# Patient Record
Sex: Male | Born: 1980 | Race: White | Hispanic: No | Smoking: Current every day smoker
Health system: Southern US, Community
[De-identification: ages and names within clinical notes are randomized; demographics above are authoritative.]

## PROBLEM LIST (undated history)

## (undated) ENCOUNTER — Encounter

## (undated) ENCOUNTER — Ambulatory Visit: Payer: PRIVATE HEALTH INSURANCE | Attending: Anesthesiology | Primary: Anesthesiology

## (undated) ENCOUNTER — Ambulatory Visit: Payer: PRIVATE HEALTH INSURANCE

## (undated) ENCOUNTER — Telehealth

## (undated) ENCOUNTER — Encounter: Attending: Anesthesiology | Primary: Anesthesiology

## (undated) ENCOUNTER — Ambulatory Visit: Payer: Medicaid (Managed Care) | Attending: Emergency Medicine | Primary: Emergency Medicine

## (undated) ENCOUNTER — Ambulatory Visit

## (undated) ENCOUNTER — Ambulatory Visit: Payer: PRIVATE HEALTH INSURANCE | Attending: Clinical | Primary: Clinical

## (undated) ENCOUNTER — Ambulatory Visit: Payer: Medicaid (Managed Care)

## (undated) ENCOUNTER — Ambulatory Visit: Payer: Medicaid (Managed Care) | Attending: Anesthesiology | Primary: Anesthesiology

## (undated) ENCOUNTER — Encounter
Attending: Pharmacist Clinician (PhC)/ Clinical Pharmacy Specialist | Primary: Pharmacist Clinician (PhC)/ Clinical Pharmacy Specialist

## (undated) ENCOUNTER — Encounter: Attending: Family | Primary: Family

## (undated) DIAGNOSIS — F1510 Other stimulant abuse, uncomplicated: Secondary | ICD-10-CM

## (undated) DIAGNOSIS — Z8673 Personal history of transient ischemic attack (TIA), and cerebral infarction without residual deficits: Secondary | ICD-10-CM

## (undated) DIAGNOSIS — Z8619 Personal history of other infectious and parasitic diseases: Secondary | ICD-10-CM

## (undated) DIAGNOSIS — B1920 Unspecified viral hepatitis C without hepatic coma: Secondary | ICD-10-CM

## (undated) DIAGNOSIS — I10 Essential (primary) hypertension: Secondary | ICD-10-CM

## (undated) DIAGNOSIS — F1110 Opioid abuse, uncomplicated: Secondary | ICD-10-CM

---

## 2004-08-13 ENCOUNTER — Emergency Department: Payer: Self-pay | Admitting: Emergency Medicine

## 2004-09-18 ENCOUNTER — Emergency Department: Payer: Self-pay | Admitting: General Practice

## 2004-11-01 ENCOUNTER — Emergency Department: Payer: Self-pay | Admitting: Internal Medicine

## 2004-12-29 ENCOUNTER — Emergency Department: Payer: Self-pay | Admitting: Emergency Medicine

## 2005-01-15 ENCOUNTER — Emergency Department: Payer: Self-pay | Admitting: Emergency Medicine

## 2005-02-24 ENCOUNTER — Emergency Department: Payer: Self-pay | Admitting: Emergency Medicine

## 2005-05-16 ENCOUNTER — Other Ambulatory Visit: Payer: Self-pay

## 2005-05-16 ENCOUNTER — Emergency Department: Payer: Self-pay | Admitting: Emergency Medicine

## 2005-05-25 ENCOUNTER — Emergency Department: Payer: Self-pay | Admitting: Emergency Medicine

## 2005-09-27 ENCOUNTER — Emergency Department: Payer: Self-pay | Admitting: Emergency Medicine

## 2006-01-06 ENCOUNTER — Emergency Department: Payer: Self-pay | Admitting: General Practice

## 2006-03-07 ENCOUNTER — Emergency Department: Payer: Self-pay | Admitting: Emergency Medicine

## 2006-05-27 ENCOUNTER — Other Ambulatory Visit: Payer: Self-pay

## 2006-05-27 ENCOUNTER — Emergency Department: Payer: Self-pay | Admitting: Emergency Medicine

## 2006-09-22 ENCOUNTER — Emergency Department: Payer: Self-pay | Admitting: Emergency Medicine

## 2006-11-24 ENCOUNTER — Emergency Department: Payer: Self-pay | Admitting: Emergency Medicine

## 2007-02-10 ENCOUNTER — Emergency Department: Payer: Self-pay | Admitting: Emergency Medicine

## 2007-04-09 ENCOUNTER — Emergency Department: Payer: Self-pay | Admitting: Emergency Medicine

## 2007-07-19 ENCOUNTER — Other Ambulatory Visit: Payer: Self-pay

## 2007-07-19 ENCOUNTER — Emergency Department: Payer: Self-pay | Admitting: Emergency Medicine

## 2007-08-16 ENCOUNTER — Emergency Department: Payer: Self-pay | Admitting: Emergency Medicine

## 2008-04-17 ENCOUNTER — Emergency Department: Payer: Self-pay | Admitting: Emergency Medicine

## 2009-10-10 ENCOUNTER — Ambulatory Visit: Payer: Self-pay | Admitting: Family Medicine

## 2010-01-23 ENCOUNTER — Ambulatory Visit: Payer: Self-pay

## 2010-06-01 ENCOUNTER — Observation Stay: Payer: Self-pay | Admitting: Internal Medicine

## 2010-06-02 ENCOUNTER — Emergency Department: Payer: Self-pay | Admitting: Internal Medicine

## 2011-01-02 ENCOUNTER — Emergency Department: Payer: Self-pay | Admitting: Emergency Medicine

## 2011-04-07 ENCOUNTER — Emergency Department: Payer: Self-pay | Admitting: Emergency Medicine

## 2011-06-07 ENCOUNTER — Emergency Department: Payer: Self-pay | Admitting: Emergency Medicine

## 2011-07-18 ENCOUNTER — Emergency Department: Payer: Self-pay | Admitting: Emergency Medicine

## 2011-08-02 ENCOUNTER — Emergency Department: Payer: Self-pay | Admitting: Internal Medicine

## 2011-09-27 ENCOUNTER — Emergency Department: Payer: Self-pay | Admitting: Internal Medicine

## 2012-06-28 ENCOUNTER — Emergency Department: Payer: Self-pay | Admitting: Emergency Medicine

## 2013-02-08 ENCOUNTER — Emergency Department: Payer: Self-pay | Admitting: Emergency Medicine

## 2013-06-08 ENCOUNTER — Emergency Department: Payer: Self-pay | Admitting: Emergency Medicine

## 2013-06-08 LAB — URINALYSIS, COMPLETE
Bilirubin,UR: NEGATIVE
Glucose,UR: 50 mg/dL (ref 0–75)
Nitrite: NEGATIVE
Ph: 6 (ref 4.5–8.0)
Protein: NEGATIVE
RBC,UR: 4 /HPF (ref 0–5)
Specific Gravity: 1.021 (ref 1.003–1.030)
WBC UR: 17 /HPF (ref 0–5)

## 2013-06-08 LAB — HEPATIC FUNCTION PANEL A (ARMC)
Alkaline Phosphatase: 78 U/L (ref 50–136)
SGOT(AST): 48 U/L — ABNORMAL HIGH (ref 15–37)
SGPT (ALT): 108 U/L — ABNORMAL HIGH (ref 12–78)

## 2013-06-08 LAB — BASIC METABOLIC PANEL
Anion Gap: 5 — ABNORMAL LOW (ref 7–16)
BUN: 13 mg/dL (ref 7–18)
Calcium, Total: 8.7 mg/dL (ref 8.5–10.1)
Co2: 27 mmol/L (ref 21–32)
Creatinine: 0.93 mg/dL (ref 0.60–1.30)
EGFR (Non-African Amer.): >60
Potassium: 4 mmol/L (ref 3.5–5.1)

## 2013-06-08 LAB — CBC
HCT: 48.1 % (ref 40.0–52.0)
MCH: 30.7 pg (ref 26.0–34.0)
MCV: 88 fL (ref 80–100)
RBC: 5.46 10*6/uL (ref 4.40–5.90)
WBC: 8.7 10*3/uL (ref 3.8–10.6)

## 2013-06-08 LAB — LIPASE, BLOOD: Lipase: 174 U/L (ref 73–393)

## 2013-06-10 LAB — URINE CULTURE

## 2013-08-06 ENCOUNTER — Emergency Department: Payer: Self-pay | Admitting: Internal Medicine

## 2013-08-06 LAB — CBC
HCT: 45.8 % (ref 40.0–52.0)
MCH: 30.7 pg (ref 26.0–34.0)
MCV: 89 fL (ref 80–100)
RBC: 5.14 10*6/uL (ref 4.40–5.90)
WBC: 5.9 10*3/uL (ref 3.8–10.6)

## 2013-08-06 LAB — URINALYSIS, COMPLETE
Bacteria: NONE SEEN
Bilirubin,UR: NEGATIVE
Blood: NEGATIVE
Ph: 6 (ref 4.5–8.0)
RBC,UR: 1 /HPF (ref 0–5)
Squamous Epithelial: NONE SEEN

## 2013-08-06 LAB — COMPREHENSIVE METABOLIC PANEL
Albumin: 3.7 g/dL (ref 3.4–5.0)
Anion Gap: 4 — ABNORMAL LOW (ref 7–16)
BUN: 17 mg/dL (ref 7–18)
Calcium, Total: 8.6 mg/dL (ref 8.5–10.1)
Co2: 25 mmol/L (ref 21–32)
EGFR (African American): >60
EGFR (Non-African Amer.): >60
Glucose: 105 mg/dL — ABNORMAL HIGH (ref 65–99)
Osmolality: 278 (ref 275–301)
Potassium: 4 mmol/L (ref 3.5–5.1)
SGPT (ALT): 47 U/L (ref 12–78)
Total Protein: 6.9 g/dL (ref 6.4–8.2)

## 2013-12-27 ENCOUNTER — Emergency Department: Payer: Self-pay | Admitting: Emergency Medicine

## 2013-12-27 LAB — BASIC METABOLIC PANEL
ANION GAP: 5 — AB (ref 7–16)
BUN: 16 mg/dL (ref 7–18)
CHLORIDE: 109 mmol/L — AB (ref 98–107)
CO2: 24 mmol/L (ref 21–32)
Calcium, Total: 9.5 mg/dL (ref 8.5–10.1)
Creatinine: 1.1 mg/dL (ref 0.60–1.30)
EGFR (Non-African Amer.): >60
GLUCOSE: 94 mg/dL (ref 65–99)
Osmolality: 277 (ref 275–301)
Potassium: 3.9 mmol/L (ref 3.5–5.1)
Sodium: 138 mmol/L (ref 136–145)

## 2013-12-27 LAB — CBC
HCT: 49.5 % (ref 40.0–52.0)
HGB: 16.7 g/dL (ref 13.0–18.0)
MCH: 30.1 pg (ref 26.0–34.0)
MCHC: 33.7 g/dL (ref 32.0–36.0)
MCV: 89 fL (ref 80–100)
PLATELETS: 221 10*3/uL (ref 150–440)
RBC: 5.55 10*6/uL (ref 4.40–5.90)
RDW: 13.7 % (ref 11.5–14.5)
WBC: 9.3 10*3/uL (ref 3.8–10.6)

## 2013-12-27 LAB — TROPONIN I

## 2014-01-12 ENCOUNTER — Emergency Department: Payer: Self-pay | Admitting: Emergency Medicine

## 2014-03-07 ENCOUNTER — Emergency Department: Payer: Self-pay | Admitting: Emergency Medicine

## 2014-03-07 LAB — COMPREHENSIVE METABOLIC PANEL
ALBUMIN: 3.8 g/dL (ref 3.4–5.0)
ANION GAP: 7 (ref 7–16)
AST: 31 U/L (ref 15–37)
Alkaline Phosphatase: 76 U/L
BILIRUBIN TOTAL: 0.5 mg/dL (ref 0.2–1.0)
BUN: 17 mg/dL (ref 7–18)
CHLORIDE: 107 mmol/L (ref 98–107)
CO2: 26 mmol/L (ref 21–32)
CREATININE: 1.05 mg/dL (ref 0.60–1.30)
Calcium, Total: 9.2 mg/dL (ref 8.5–10.1)
EGFR (African American): >60
GLUCOSE: 92 mg/dL (ref 65–99)
OSMOLALITY: 281 (ref 275–301)
Potassium: 4.1 mmol/L (ref 3.5–5.1)
SGPT (ALT): 68 U/L (ref 12–78)
Sodium: 140 mmol/L (ref 136–145)
TOTAL PROTEIN: 8.1 g/dL (ref 6.4–8.2)

## 2014-03-07 LAB — CBC WITH DIFFERENTIAL/PLATELET
Basophil #: 0 10*3/uL (ref 0.0–0.1)
Basophil %: 0.3 %
Eosinophil #: 0.2 10*3/uL (ref 0.0–0.7)
Eosinophil %: 2.1 %
HCT: 53.6 % — AB (ref 40.0–52.0)
HGB: 17.5 g/dL (ref 13.0–18.0)
LYMPHS ABS: 1.9 10*3/uL (ref 1.0–3.6)
LYMPHS PCT: 22.6 %
MCH: 29.3 pg (ref 26.0–34.0)
MCHC: 32.7 g/dL (ref 32.0–36.0)
MCV: 90 fL (ref 80–100)
MONOS PCT: 7.5 %
Monocyte #: 0.6 x10 3/mm (ref 0.2–1.0)
NEUTROS ABS: 5.8 10*3/uL (ref 1.4–6.5)
Neutrophil %: 67.5 %
PLATELETS: 213 10*3/uL (ref 150–440)
RBC: 5.98 10*6/uL — ABNORMAL HIGH (ref 4.40–5.90)
RDW: 13.6 % (ref 11.5–14.5)
WBC: 8.6 10*3/uL (ref 3.8–10.6)

## 2015-06-07 ENCOUNTER — Encounter: Payer: Self-pay | Admitting: *Deleted

## 2015-06-07 ENCOUNTER — Emergency Department
Admission: EM | Admit: 2015-06-07 | Discharge: 2015-06-07 | Disposition: A | Discharge status: Home / Self Care | Payer: Self-pay | Attending: Emergency Medicine | Admitting: Emergency Medicine

## 2015-06-07 DIAGNOSIS — N390 Urinary tract infection, site not specified: Secondary | ICD-10-CM | POA: Insufficient documentation

## 2015-06-07 DIAGNOSIS — Z720 Tobacco use: Secondary | ICD-10-CM | POA: Insufficient documentation

## 2015-06-07 DIAGNOSIS — Y998 Other external cause status: Secondary | ICD-10-CM | POA: Insufficient documentation

## 2015-06-07 DIAGNOSIS — Y9389 Activity, other specified: Secondary | ICD-10-CM | POA: Insufficient documentation

## 2015-06-07 DIAGNOSIS — W57XXXA Bitten or stung by nonvenomous insect and other nonvenomous arthropods, initial encounter: Secondary | ICD-10-CM | POA: Insufficient documentation

## 2015-06-07 DIAGNOSIS — S30861A Insect bite (nonvenomous) of abdominal wall, initial encounter: Secondary | ICD-10-CM | POA: Insufficient documentation

## 2015-06-07 DIAGNOSIS — Z880 Allergy status to penicillin: Secondary | ICD-10-CM | POA: Insufficient documentation

## 2015-06-07 DIAGNOSIS — I10 Essential (primary) hypertension: Secondary | ICD-10-CM | POA: Insufficient documentation

## 2015-06-07 DIAGNOSIS — Y9289 Other specified places as the place of occurrence of the external cause: Secondary | ICD-10-CM | POA: Insufficient documentation

## 2015-06-07 HISTORY — DX: Essential (primary) hypertension: I10

## 2015-06-07 HISTORY — DX: Personal history of other infectious and parasitic diseases: Z86.19

## 2015-06-07 LAB — COMPREHENSIVE METABOLIC PANEL
ALBUMIN: 4.3 g/dL (ref 3.5–5.0)
ALT: 49 U/L (ref 17–63)
AST: 44 U/L — AB (ref 15–41)
Alkaline Phosphatase: 59 U/L (ref 38–126)
Anion gap: 9 (ref 5–15)
BUN: 21 mg/dL — AB (ref 6–20)
CHLORIDE: 102 mmol/L (ref 101–111)
CO2: 25 mmol/L (ref 22–32)
CREATININE: 1.18 mg/dL (ref 0.61–1.24)
Calcium: 8.9 mg/dL (ref 8.9–10.3)
GFR calc Af Amer: >60 mL/min (ref >60)
GFR calc non Af Amer: >60 mL/min (ref >60)
Glucose, Bld: 102 mg/dL — ABNORMAL HIGH (ref 65–99)
POTASSIUM: 3.8 mmol/L (ref 3.5–5.1)
Sodium: 136 mmol/L (ref 135–145)
Total Bilirubin: 0.7 mg/dL (ref 0.3–1.2)
Total Protein: 7.3 g/dL (ref 6.5–8.1)

## 2015-06-07 LAB — CBC WITH DIFFERENTIAL/PLATELET
BASOS ABS: 0 10*3/uL (ref 0–0.1)
Basophils Relative: 1 %
EOS PCT: 4 %
Eosinophils Absolute: 0.3 10*3/uL (ref 0–0.7)
HCT: 46.9 % (ref 40.0–52.0)
HEMOGLOBIN: 16 g/dL (ref 13.0–18.0)
LYMPHS PCT: 25 %
Lymphs Abs: 2 10*3/uL (ref 1.0–3.6)
MCH: 30.1 pg (ref 26.0–34.0)
MCHC: 34.1 g/dL (ref 32.0–36.0)
MCV: 88.3 fL (ref 80.0–100.0)
MONO ABS: 0.8 10*3/uL (ref 0.2–1.0)
Monocytes Relative: 10 %
Neutro Abs: 4.7 10*3/uL (ref 1.4–6.5)
Neutrophils Relative %: 60 %
Platelets: 192 10*3/uL (ref 150–440)
RBC: 5.31 MIL/uL (ref 4.40–5.90)
RDW: 13.4 % (ref 11.5–14.5)
WBC: 7.8 10*3/uL (ref 3.8–10.6)

## 2015-06-07 LAB — URINALYSIS COMPLETE WITH MICROSCOPIC (ARMC ONLY)
Bilirubin Urine: NEGATIVE
Glucose, UA: 50 mg/dL — AB
KETONES UR: NEGATIVE mg/dL
NITRITE: NEGATIVE
PROTEIN: NEGATIVE mg/dL
SPECIFIC GRAVITY, URINE: 1.032 — AB (ref 1.005–1.030)
pH: 5 (ref 5.0–8.0)

## 2015-06-07 LAB — MAGNESIUM: Magnesium: 2.2 mg/dL (ref 1.7–2.4)

## 2015-06-07 MED ORDER — HYDROCODONE-ACETAMINOPHEN 5-325 MG PO TABS: 1.0000 | ORAL_TABLET | ORAL | Status: DC | PRN

## 2015-06-07 MED ORDER — HYDROCODONE-ACETAMINOPHEN 5-325 MG PO TABS
1.0000 | ORAL_TABLET | Freq: Once | ORAL | Status: AC
  Administered 2015-06-07: 1 via ORAL
  Filled 2015-06-07: qty 1

## 2015-06-07 MED ORDER — SULFAMETHOXAZOLE-TRIMETHOPRIM 800-160 MG PO TABS: 1.0000 | ORAL_TABLET | Freq: Two times a day (BID) | ORAL | Status: DC

## 2015-06-07 MED ORDER — SODIUM CHLORIDE 0.9 % IV BOLUS (SEPSIS)
1000.0000 mL | Freq: Once | INTRAVENOUS | Status: AC
  Administered 2015-06-07: 1000 mL via INTRAVENOUS

## 2015-06-07 NOTE — Discharge Instructions (Signed)
YOU SHOULD FOLLOW UP WITH UROLOGIST LISTED ON YOUR DISCHARGE PAPERS TAKE ALL OF THE ANTIBIOTICS UNTIL GONE INCREASE FLUIDS

## 2015-06-07 NOTE — ED Provider Notes (Signed)
[facility] Emergency Department Provider Note  ____________________________________________  Time seen: Approximately 1:40 PM  I have reviewed the triage vital signs and the nursing notes.   HISTORY  Chief Complaint Sore Throat; Rash; and Generalized Body Aches   HPI Isaiah Green. is a 34 y.o. male is here today with complaint of sore throat and generalized body aches with cramps for approximately 1 week. He states he sweats a lot at work and thinks this is what is causing cramps. He has been taking pain medication at home for sore throat. He states he also has a history of pulling a tick off his body approximately 3 weeks ago. He states every year for the last 3 years he has had "tick fever". He is uncertain of fever at home. He denies any other symptoms. Currently he rates his pain 9 out of 10. He denies any nausea, vomitingor diarrhea   Past Medical History  Diagnosis Date  . H/o Lyme disease   . Hypertension     There are no active problems to display for this patient.   History reviewed. No pertinent past surgical history.  Current Outpatient Rx  Name  Route  Sig  Dispense  Refill  . HYDROcodone-acetaminophen (NORCO/VICODIN) 5-325 MG per tablet   Oral   Take 1 tablet by mouth every 4 (four) hours as needed for moderate pain.   20 tablet   0   . sulfamethoxazole-trimethoprim (BACTRIM DS,SEPTRA DS) 800-160 MG per tablet   Oral   Take 1 tablet by mouth 2 (two) times daily.   20 tablet   0     Allergies Aspirin; Penicillins; and Tegretol  History reviewed. No pertinent family history.  Social History Social History  Substance Use Topics  . Smoking status: Current Every Day Smoker    Types: Cigars  . Smokeless tobacco: None  . Alcohol Use: No    Review of Systems Constitutional: Positive fever/chills ENT: No sore throat. Cardiovascular: Denies chest pain. Respiratory: Denies shortness of breath. Gastrointestinal: No abdominal  pain.  No nausea, no vomiting.  No diarrhea. Genitourinary: Negative for dysuria. Musculoskeletal: Negative for back pain. Skin: Negative for rash. Tick bites Neurological: Negative for headaches, focal weakness or numbness.  10-point ROS otherwise negative.  ____________________________________________   PHYSICAL EXAM:  VITAL SIGNS: ED Triage Vitals  Enc Vitals Group     BP 06/07/15 1305 158/83 mmHg     Pulse Rate 06/07/15 1305 95     Resp 06/07/15 1305 16     Temp 06/07/15 1305 97.7 F (36.5 C)     Temp Source 06/07/15 1305 Oral     SpO2 06/07/15 1305 98 %     Weight 06/07/15 1306 212 lb 8 oz (96.389 kg)     Height 06/07/15 1305 6\' 2"  (1.88 m)     Head Cir --      Peak Flow --      Pain Score 06/07/15 1304 9     Pain Loc --      Pain Edu? --      Excl. in GC? --     Constitutional: Alert and oriented. Well appearing and in no acute distress. Eyes: Conjunctivae are normal. PERRL. EOMI. Head: Atraumatic. Nose: No congestion/rhinnorhea. Mouth/Throat: Mucous membranes are moist.  Oropharynx non-erythematous. Neck: No stridor.  Supple Hematological/Lymphatic/Immunilogical: No cervical lymphadenopathy. Cardiovascular: Normal rate, regular rhythm. Grossly normal heart sounds.  Good peripheral circulation. Respiratory: Normal respiratory effort.  No retractions. Lungs CTAB. Gastrointestinal: Soft and  nontender. No distention. No abdominal bruits. No CVA tenderness. Musculoskeletal: No lower extremity tenderness nor edema.  No joint effusions. Neurologic:  Normal speech and language. No gross focal neurologic deficits are appreciated. No gait instability. Skin:  Skin is warm, dry. There is one single tick bite to the left side abdomen that is a small red papule there is no warmth or drainage in this area. Psychiatric: Mood and affect are normal. Speech and behavior are normal.  ____________________________________________   LABS (all labs ordered are listed, but only  abnormal results are displayed)  Labs Reviewed  COMPREHENSIVE METABOLIC PANEL - Abnormal; Notable for the following:    Glucose, Bld 102 (*)    BUN 21 (*)    AST 44 (*)    All other components within normal limits  URINALYSIS COMPLETEWITH MICROSCOPIC ([facility] ONLY) - Abnormal; Notable for the following:    Color, Urine YELLOW (*)    APPearance HAZY (*)    Glucose, UA 50 (*)    Specific Gravity, Urine 1.032 (*)    Hgb urine dipstick 1+ (*)    Leukocytes, UA 2+ (*)    Bacteria, UA RARE (*)    Squamous Epithelial / LPF 0-5 (*)    All other components within normal limits  CBC WITH DIFFERENTIAL/PLATELET  MAGNESIUM     PROCEDURES  Procedure(s) performed: None  Critical Care performed: No  ____________________________________________   INITIAL IMPRESSION / ASSESSMENT AND PLAN / ED COURSE  Pertinent labs & imaging results that were available during my care of the patient were reviewed by me and considered in my medical decision making (see chart for details).  Patient was told he had a urinary tract infection. He requested giving some fluids while he was in the emergency room as he feels dehydrated. Will start him on Bactrim DS for infection. He'll follow-up with [facility]  clinic if any continued problems. ____________________________________________   FINAL CLINICAL IMPRESSION(S) / ED DIAGNOSES  Final diagnoses:  Acute urinary tract infection      Tommi Rumps, PA-C 06/07/15 1622  Emily Filbert, MD 06/08/15 [phone]

## 2015-06-07 NOTE — ED Notes (Signed)
Pt reports sore throat, rash under arms, generalized body aches. Pulled tick off of body 3 weeks ago. Hx of tick fever and lymes disease.

## 2015-06-07 NOTE — ED Notes (Signed)
Pt states generalized body aches and cramps for 1 week, pt states he sweats a lot at work, pt been taking pain medication at home, pt alert in no distress, pt states some sore throat feeling

## 2015-06-14 ENCOUNTER — Emergency Department
Admission: EM | Admit: 2015-06-14 | Discharge: 2015-06-14 | Disposition: A | Discharge status: Home / Self Care | Payer: Self-pay | Attending: Emergency Medicine | Admitting: Emergency Medicine

## 2015-06-14 ENCOUNTER — Encounter: Payer: Self-pay | Admitting: Emergency Medicine

## 2015-06-14 DIAGNOSIS — Z79899 Other long term (current) drug therapy: Secondary | ICD-10-CM | POA: Insufficient documentation

## 2015-06-14 DIAGNOSIS — A938 Other specified arthropod-borne viral fevers: Secondary | ICD-10-CM | POA: Insufficient documentation

## 2015-06-14 DIAGNOSIS — N390 Urinary tract infection, site not specified: Secondary | ICD-10-CM

## 2015-06-14 DIAGNOSIS — Z720 Tobacco use: Secondary | ICD-10-CM | POA: Insufficient documentation

## 2015-06-14 DIAGNOSIS — Z880 Allergy status to penicillin: Secondary | ICD-10-CM | POA: Insufficient documentation

## 2015-06-14 DIAGNOSIS — B882 Other arthropod infestations: Secondary | ICD-10-CM

## 2015-06-14 DIAGNOSIS — I10 Essential (primary) hypertension: Secondary | ICD-10-CM | POA: Insufficient documentation

## 2015-06-14 LAB — CBC WITH DIFFERENTIAL/PLATELET
BASOS ABS: 0.1 10*3/uL (ref 0–0.1)
Basophils Relative: 1 %
Eosinophils Absolute: 0.2 10*3/uL (ref 0–0.7)
Eosinophils Relative: 3 %
HEMATOCRIT: 48.7 % (ref 40.0–52.0)
HEMOGLOBIN: 16.2 g/dL (ref 13.0–18.0)
LYMPHS PCT: 22 %
Lymphs Abs: 2 10*3/uL (ref 1.0–3.6)
MCH: 29.8 pg (ref 26.0–34.0)
MCHC: 33.3 g/dL (ref 32.0–36.0)
MCV: 89.4 fL (ref 80.0–100.0)
MONOS PCT: 5 %
Monocytes Absolute: 0.4 10*3/uL (ref 0.2–1.0)
NEUTROS PCT: 69 %
Neutro Abs: 6.3 10*3/uL (ref 1.4–6.5)
Platelets: 182 10*3/uL (ref 150–440)
RBC: 5.45 MIL/uL (ref 4.40–5.90)
RDW: 13.8 % (ref 11.5–14.5)
WBC: 8.9 10*3/uL (ref 3.8–10.6)

## 2015-06-14 LAB — COMPREHENSIVE METABOLIC PANEL
ALK PHOS: 56 U/L (ref 38–126)
ALT: 39 U/L (ref 17–63)
AST: 38 U/L (ref 15–41)
Albumin: 4.1 g/dL (ref 3.5–5.0)
Anion gap: 7 (ref 5–15)
BILIRUBIN TOTAL: 0.5 mg/dL (ref 0.3–1.2)
BUN: 13 mg/dL (ref 6–20)
CALCIUM: 8.6 mg/dL — AB (ref 8.9–10.3)
CO2: 23 mmol/L (ref 22–32)
CREATININE: 1.13 mg/dL (ref 0.61–1.24)
Chloride: 103 mmol/L (ref 101–111)
GFR calc Af Amer: >60 mL/min (ref >60)
GFR calc non Af Amer: >60 mL/min (ref >60)
GLUCOSE: 151 mg/dL — AB (ref 65–99)
Potassium: 3.5 mmol/L (ref 3.5–5.1)
Sodium: 133 mmol/L — ABNORMAL LOW (ref 135–145)
TOTAL PROTEIN: 7.2 g/dL (ref 6.5–8.1)

## 2015-06-14 LAB — CHLAMYDIA/NGC RT PCR (ARMC ONLY)
Chlamydia Tr: NOT DETECTED
N gonorrhoeae: NOT DETECTED

## 2015-06-14 LAB — URINALYSIS COMPLETE WITH MICROSCOPIC (ARMC ONLY)
Bacteria, UA: NONE SEEN
Bilirubin Urine: NEGATIVE
Glucose, UA: 50 mg/dL — AB
HGB URINE DIPSTICK: NEGATIVE
Ketones, ur: NEGATIVE mg/dL
Nitrite: NEGATIVE
Protein, ur: NEGATIVE mg/dL
Specific Gravity, Urine: 1.028 (ref 1.005–1.030)
pH: 5 (ref 5.0–8.0)

## 2015-06-14 LAB — CK: CK TOTAL: 82 U/L (ref 49–397)

## 2015-06-14 MED ORDER — ONDANSETRON HCL 4 MG/2ML IJ SOLN
INTRAMUSCULAR | Status: AC
  Administered 2015-06-14: 4 mg via INTRAVENOUS
  Filled 2015-06-14: qty 2

## 2015-06-14 MED ORDER — OXYCODONE-ACETAMINOPHEN 5-325 MG PO TABS
1.0000 | ORAL_TABLET | Freq: Once | ORAL | Status: AC
  Administered 2015-06-14: 1 via ORAL

## 2015-06-14 MED ORDER — NITROFURANTOIN MONOHYD MACRO 100 MG PO CAPS
100.0000 mg | ORAL_CAPSULE | Freq: Once | ORAL | Status: AC
  Administered 2015-06-14: 100 mg via ORAL

## 2015-06-14 MED ORDER — DOXYCYCLINE HYCLATE 100 MG PO TABS
ORAL_TABLET | ORAL | Status: AC
  Administered 2015-06-14: 100 mg via ORAL
  Filled 2015-06-14: qty 1

## 2015-06-14 MED ORDER — SODIUM CHLORIDE 0.9 % IV BOLUS (SEPSIS)
1000.0000 mL | Freq: Once | INTRAVENOUS | Status: AC
  Administered 2015-06-14: 1000 mL via INTRAVENOUS

## 2015-06-14 MED ORDER — NITROFURANTOIN MONOHYD MACRO 100 MG PO CAPS
ORAL_CAPSULE | ORAL | Status: AC
  Filled 2015-06-14: qty 1

## 2015-06-14 MED ORDER — ONDANSETRON HCL 4 MG/2ML IJ SOLN
4.0000 mg | Freq: Once | INTRAMUSCULAR | Status: AC
  Administered 2015-06-14: 4 mg via INTRAVENOUS

## 2015-06-14 MED ORDER — DOXYCYCLINE HYCLATE 100 MG PO TABS
100.0000 mg | ORAL_TABLET | Freq: Once | ORAL | Status: AC
  Administered 2015-06-14: 100 mg via ORAL
  Filled 2015-06-14: qty 1

## 2015-06-14 MED ORDER — NITROFURANTOIN MONOHYD MACRO 100 MG PO CAPS: 100.0000 mg | ORAL_CAPSULE | Freq: Two times a day (BID) | ORAL | Status: AC

## 2015-06-14 MED ORDER — DOXYCYCLINE HYCLATE 100 MG PO TABS: 100.0000 mg | ORAL_TABLET | Freq: Two times a day (BID) | ORAL | Status: DC

## 2015-06-14 MED ORDER — OXYCODONE-ACETAMINOPHEN 5-325 MG PO TABS
ORAL_TABLET | ORAL | Status: AC
  Administered 2015-06-14: 1 via ORAL
  Filled 2015-06-14: qty 1

## 2015-06-14 NOTE — ED Provider Notes (Addendum)
[facility] Emergency Department Provider Note  ____________________________________________  Time seen: Approximately 2:30 PM  I have reviewed the triage vital signs and the nursing notes.   HISTORY  Chief Complaint Weakness    HPI Isaiah Green. is a 34 y.o. male with a history of Lyme disease and tick fever who is presenting today with diffuse body aches as well as a fever and chills for the past 2-3 days. He says thathe was here one week ago was discharged on Bactrim for urinary tract infection. Says was also discharged with Vicodin 20 which she has finished at this time after taking them every 4 hours. He says that he had Lyme disease last summer and felt similarly. He said that the tick 3 weeks ago was small and engorged. It was on his left lower abdomen. He says he is also having joint aches at this time. Denies any swelling. Says is having decreased urination. Says had a rash to his bilateral axilla as well as groin which is now resolved. Denies any target-like rashes. No dysuria.   Past Medical History  Diagnosis Date  . H/o Lyme disease   . Hypertension     There are no active problems to display for this patient.   History reviewed. No pertinent past surgical history.  Current Outpatient Rx  Name  Route  Sig  Dispense  Refill  . HYDROcodone-acetaminophen (NORCO/VICODIN) 5-325 MG per tablet   Oral   Take 1 tablet by mouth every 4 (four) hours as needed for moderate pain.   20 tablet   0   . sulfamethoxazole-trimethoprim (BACTRIM DS,SEPTRA DS) 800-160 MG per tablet   Oral   Take 1 tablet by mouth 2 (two) times daily.   20 tablet   0     Allergies Aspirin; Penicillins; and Tegretol  History reviewed. No pertinent family history.  Social History Social History  Substance Use Topics  . Smoking status: Current Every Day Smoker    Types: Cigars  . Smokeless tobacco: None  . Alcohol Use: No    Review of Systems Constitutional:  No fever/chills Eyes: No visual changes. ENT: No sore throat. Cardiovascular: Denies chest pain. Respiratory: Denies shortness of breath. Gastrointestinal: No abdominal pain.    No diarrhea.  No constipation. Genitourinary: Negative for dysuria. Musculoskeletal: Diffuse body aches  Skin: As above  Neurological: Negative for headaches, focal weakness or numbness.  10-point ROS otherwise negative.  ____________________________________________   PHYSICAL EXAM:  VITAL SIGNS: ED Triage Vitals  Enc Vitals Group     BP 06/14/15 1306 131/81 mmHg     Pulse Rate 06/14/15 1306 87     Resp 06/14/15 1306 20     Temp 06/14/15 1306 98.1 F (36.7 C)     Temp src --      SpO2 06/14/15 1306 98 %     Weight 06/14/15 1306 214 lb 5 oz (97.212 kg)     Height 06/14/15 1306 6\' 2"  (1.88 m)     Head Cir --      Peak Flow --      Pain Score 06/14/15 1307 9     Pain Loc --      Pain Edu? --      Excl. in GC? --     Constitutional: Alert and oriented. Well appearing and in no acute distress. Eyes: Conjunctivae are normal. PERRL. EOMI. Head: Atraumatic. Nose: No congestion/rhinnorhea. Mouth/Throat: Mucous membranes are moist.  Oropharynx non-erythematous. Neck: No stridor.  Cardiovascular: Normal rate, regular rhythm. Grossly normal heart sounds.  Good peripheral circulation. Respiratory: Normal respiratory effort.  No retractions. Lungs CTAB. Gastrointestinal: Soft and nontender. No distention. No abdominal bruits. No CVA tenderness. Musculoskeletal: No lower extremity tenderness nor edema.  No joint effusions. Neurologic:  Normal speech and language. No gross focal neurologic deficits are appreciated. No gait instability. Skin:  Skin is warm, dry and intact. No rash noted. Psychiatric: Mood and affect are normal. Speech and behavior are normal.  ____________________________________________   LABS (all labs ordered are listed, but only abnormal results are displayed)  Labs Reviewed   COMPREHENSIVE METABOLIC PANEL - Abnormal; Notable for the following:    Sodium 133 (*)    Glucose, Bld 151 (*)    Calcium 8.6 (*)    All other components within normal limits  URINALYSIS COMPLETEWITH MICROSCOPIC ([facility] ONLY) - Abnormal; Notable for the following:    Color, Urine YELLOW (*)    APPearance HAZY (*)    Glucose, UA 50 (*)    Leukocytes, UA 3+ (*)    Squamous Epithelial / LPF 0-5 (*)    All other components within normal limits  URINE CULTURE  CBC WITH DIFFERENTIAL/PLATELET  CK  B. BURGDORFI ANTIBODIES  ROCKY MTN SPOTTED FVR ABS PNL(IGG+IGM)   ____________________________________________  EKG   ____________________________________________  RADIOLOGY   ____________________________________________   PROCEDURES  ____________________________________________   INITIAL IMPRESSION / ASSESSMENT AND PLAN / ED COURSE  Pertinent labs & imaging results that were available during my care of the patient were reviewed by me and considered in my medical decision making (see chart for details).  ----------------------------------------- 4:38 PM on 06/14/2015 -----------------------------------------  Pain improved after Percocet. Patient still with persistent urinary tract infection. Says has no concern for sexually transmitted diseases. No dysuria or discharge from the penis. We'll change antibiotics to Keflex for the urinary tract infection. Culture was sent for urine. We'll also treat for tickborne illnesses. Patient resting, but this time. No neck stiffness. Asking for continued course of pain medications such as Vicodin but after 20 Vicodin but feel that this would be a borderline chronic issue. I feel the best treatment for him would be to get him on the correct choice of antibiotics. ____________________________________________   FINAL CLINICAL IMPRESSION(S) / ED DIAGNOSES  Acute tickborne illness. Acute urinary tract infection. Return visit.    Myrna Blazer, MD 06/14/15 1640  Offered to prescribe the patient tramadol but he says that this makes him itch all over.  Myrna Blazer, MD 06/14/15 [phone]

## 2015-06-14 NOTE — ED Notes (Signed)
Seen here, x1 week ago , with rash and tick bite , with weakness and cramping feeling. Fever / shaking x2- 3 days

## 2015-06-14 NOTE — ED Notes (Signed)
Reports generalized weakness and bodyaches x2 wks.  States he was bit by a tick and seen here and put on bactrim and vicodin but not feeling better

## 2015-06-16 LAB — ROCKY MTN SPOTTED FVR ABS PNL(IGG+IGM)
RMSF IGM: 0.71 {index} (ref 0.00–0.89)
RMSF IgG: UNDETERMINED

## 2015-06-16 LAB — B. BURGDORFI ANTIBODIES: B burgdorferi Ab IgG+IgM: <0.91 {ISR} (ref 0.00–0.90)

## 2015-06-16 LAB — RMSF, IGG, IFA

## 2015-06-16 LAB — URINE CULTURE: Culture: NO GROWTH

## 2015-09-21 ENCOUNTER — Encounter: Payer: Self-pay | Admitting: Emergency Medicine

## 2015-09-21 ENCOUNTER — Emergency Department
Admission: EM | Admit: 2015-09-21 | Discharge: 2015-09-21 | Disposition: A | Discharge status: Home / Self Care | Payer: Self-pay | Attending: Emergency Medicine | Admitting: Emergency Medicine

## 2015-09-21 DIAGNOSIS — Z880 Allergy status to penicillin: Secondary | ICD-10-CM | POA: Insufficient documentation

## 2015-09-21 DIAGNOSIS — M7522 Bicipital tendinitis, left shoulder: Secondary | ICD-10-CM | POA: Insufficient documentation

## 2015-09-21 DIAGNOSIS — M75102 Unspecified rotator cuff tear or rupture of left shoulder, not specified as traumatic: Secondary | ICD-10-CM | POA: Insufficient documentation

## 2015-09-21 DIAGNOSIS — F17210 Nicotine dependence, cigarettes, uncomplicated: Secondary | ICD-10-CM | POA: Insufficient documentation

## 2015-09-21 DIAGNOSIS — I10 Essential (primary) hypertension: Secondary | ICD-10-CM | POA: Insufficient documentation

## 2015-09-21 DIAGNOSIS — Z79899 Other long term (current) drug therapy: Secondary | ICD-10-CM | POA: Insufficient documentation

## 2015-09-21 MED ORDER — KETOROLAC TROMETHAMINE 60 MG/2ML IM SOLN
60.0000 mg | Freq: Once | INTRAMUSCULAR | Status: AC
  Administered 2015-09-21: 60 mg via INTRAMUSCULAR
  Filled 2015-09-21: qty 2

## 2015-09-21 MED ORDER — PREDNISONE 10 MG PO TABS: 10.0000 mg | ORAL_TABLET | Freq: Every day | ORAL | Status: DC

## 2015-09-21 MED ORDER — HYDROCODONE-ACETAMINOPHEN 5-325 MG PO TABS: 1.0000 | ORAL_TABLET | Freq: Four times a day (QID) | ORAL | Status: DC | PRN

## 2015-09-21 MED ORDER — MELOXICAM 15 MG PO TABS: 15.0000 mg | ORAL_TABLET | Freq: Every day | ORAL | Status: DC

## 2015-09-21 NOTE — ED Provider Notes (Signed)
CSN: [id number]     Arrival date & time 09/21/15  1815 History   First MD Initiated Contact with Patient 09/21/15 1856     Chief Complaint  Patient presents with  . Arm Injury    left upper arm pain.     (Consider location/radiation/quality/duration/timing/severity/associated sxs/prior Treatment) HPI  34 year old male presents to respond for evaluation of left shoulder pain. He points to the anterior shoulder joint. No trauma or injury. Pain isn't present for 2 days. 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 Pain is sharp and constant but increased with abduction and flexion. He has difficulty abducting the arm. No neck pain numbness or tingling to the left upper extremity. He has been taking BC powders with minimal relief. He is left-hand dominant.  Past Medical History  Diagnosis Date  . H/o Lyme disease   . Hypertension    History reviewed. No pertinent past surgical history. History reviewed. No pertinent family history. Social History  Substance Use Topics  . Smoking status: Current Every Day Smoker -- 2.00 packs/day    Types: Cigars  . Smokeless tobacco: None  . Alcohol Use: No    Review of Systems  Constitutional: Negative.  Negative for fever, chills, activity change and appetite change.  HENT: Negative for congestion, ear pain, mouth sores, rhinorrhea, sinus pressure, sore throat and trouble swallowing.   Eyes: Negative for photophobia, pain and discharge.  Respiratory: Negative for cough, chest tightness and shortness of breath.   Cardiovascular: Negative for chest pain and leg swelling.  Gastrointestinal: Negative for nausea, vomiting, abdominal pain, diarrhea and abdominal distention.  Genitourinary: Negative for dysuria and difficulty urinating.  Musculoskeletal: Positive for arthralgias. Negative for back pain, gait problem, neck pain and neck stiffness.  Skin: Negative for color change and rash.  Neurological: Negative for dizziness and  headaches.  Hematological: Negative for adenopathy.  Psychiatric/Behavioral: Negative for behavioral problems and agitation.      Allergies  Aspirin; Penicillins; and Tegretol  Home Medications   Prior to Admission medications   Medication Sig Start Date End Date Taking? Authorizing Provider  doxycycline (VIBRA-TABS) 100 MG tablet Take 1 tablet (100 mg total) by mouth 2 (two) times daily. 06/14/15   Myrna Blazer, MD  HYDROcodone-acetaminophen (NORCO) 5-325 MG tablet Take 1 tablet by mouth every 6 (six) hours as needed for moderate pain. 09/21/15   Evon Slack, PA-C  HYDROcodone-acetaminophen (NORCO/VICODIN) 5-325 MG per tablet Take 1 tablet by mouth every 4 (four) hours as needed for moderate pain. 06/07/15   Tommi Rumps, PA-C  meloxicam (MOBIC) 15 MG tablet Take 1 tablet (15 mg total) by mouth daily. 09/21/15   Evon Slack, PA-C  predniSONE (DELTASONE) 10 MG tablet Take 1 tablet (10 mg total) by mouth daily. 6,5,4,3,2,1 six day taper 09/21/15   Evon Slack, PA-C  sulfamethoxazole-trimethoprim (BACTRIM DS,SEPTRA DS) 800-160 MG per tablet Take 1 tablet by mouth 2 (two) times daily. 06/07/15   Tommi Rumps, PA-C   BP 167/98 mmHg  Pulse 107  Temp(Src) 98 F (36.7 C) (Oral)  Resp 20  Ht  (1.88 m)  Wt 90.719 kg  BMI 25.67 kg/m2  SpO2 97% Physical Exam  Constitutional: He is oriented to person, place, and time. He appears well-developed and well-nourished.  HENT:  Head: Normocephalic and atraumatic.  Eyes: Conjunctivae and EOM are normal. Pupils are equal, round, and reactive to light.  Neck: Normal range of motion. Neck supple.  Cardiovascular: Normal rate,  regular rhythm and intact distal pulses.   Pulmonary/Chest: Effort normal and breath sounds normal. No respiratory distress. He has no wheezes. He exhibits no tenderness.  Musculoskeletal:  Cervical spine examination cervical spinous suspicious for range of motion with no discomfort. No spinous  process tenderness. Examination of the shoulder shows patient has limited range of motion with abduction and flexion. Actively left to 45. Passively I can get him to 100 but with moderate pain. He has positive drop arm test. He is tender in the bicipital groove and subacromial space. Positive Yergason's test. S4 elbow range of motion. Full composite fist shape out of 5. His neuro rest intact left upper extremity. Positive Hawkins, impingement test.  Neurological: He is alert and oriented to person, place, and time.  Skin: Skin is warm and dry.  Psychiatric: He has a normal mood and affect. His behavior is normal. Judgment and thought content normal.    ED Course  Procedures (including critical care time) Labs Review Labs Reviewed - No data to display  Imaging Review No results found. I have personally reviewed and evaluated these images and lab results as part of my medical decision-making.   EKG Interpretation None      MDM   Final diagnoses:  Rotator cuff syndrome of left shoulder  Bicipital tendinitis of left shoulder    Non traumatic left shoulder pain and 34 year old male who is left-hand dominant. 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 Pain a physical exam findings consistent with rotator cuff syndrome of bicipital tendinitis. Patient is given 60 mg Toradol IM. 60 prednisone taper followed by meloxicam. He is given a sling for comfort. He'll follow-up with orthopedics in 5-7 days.    Evon Slackhomas C Haidy Kackley, PA-C 09/21/15 1910  Sharyn CreamerMark Quale, MD 09/21/15 2352

## 2015-09-21 NOTE — Discharge Instructions (Signed)
Rotator Cuff Injury Rotator cuff injury is any type of injury to the set of muscles and tendons that make up the stabilizing unit of your shoulder. This unit holds the ball of your upper arm bone (humerus) in the socket of your shoulder blade (scapula).  CAUSES Injuries to your rotator cuff most commonly come from sports or activities that cause your arm to be moved repeatedly over your head. Examples of this include throwing, weight lifting, swimming, or racquet sports. Long lasting (chronic) irritation of your rotator cuff can cause soreness and swelling (inflammation), bursitis, and eventual damage to your tendons, such as a tear (rupture). SIGNS AND SYMPTOMS Acute rotator cuff tear:  Sudden tearing sensation followed by severe pain shooting from your upper shoulder down your arm toward your elbow.  Decreased range of motion of your shoulder because of pain and muscle spasm.  Severe pain.  Inability to raise your arm out to the side because of pain and loss of muscle power (large tears). Chronic rotator cuff tear:  Pain that usually is worse at night and may interfere with sleep.  Gradual weakness and decreased shoulder motion as the pain worsens.  Decreased range of motion. Rotator cuff tendinitis:  Deep ache in your shoulder and the outside upper arm over your shoulder.  Pain that comes on gradually and becomes worse when lifting your arm to the side or turning it inward. DIAGNOSIS Rotator cuff injury is diagnosed through a medical history, physical exam, and imaging exam. The medical history helps determine the type of rotator cuff injury. Your health care provider will look at your injured shoulder, feel the injured area, and ask you to move your shoulder in different positions. X-ray exams typically are done to rule out other causes of shoulder pain, such as fractures. MRI is the exam of choice for the most severe shoulder injuries because the images show muscles and tendons.    TREATMENT  Chronic tear:  Medicine for pain, such as acetaminophen or ibuprofen.  Physical therapy and range-of-motion exercises may be helpful in maintaining shoulder function and strength.  Steroid injections into your shoulder joint.  Surgical repair of the rotator cuff if the injury does not heal with noninvasive treatment. Acute tear:  Anti-inflammatory medicines such as ibuprofen and naproxen to help reduce pain and swelling.  A sling to help support your arm and rest your rotator cuff muscles. Long-term use of a sling is not advised. It may cause significant stiffening of the shoulder joint.  Surgery may be considered within a few weeks, especially in younger, active people, to return the shoulder to full function.  Indications for surgical treatment include the following:  Age younger than 60 years.  Rotator cuff tears that are complete.  Physical therapy, rest, and anti-inflammatory medicines have been used for 6-8 weeks, with no improvement.  Employment or sporting activity that requires constant shoulder use. Tendinitis:  Anti-inflammatory medicines such as ibuprofen and naproxen to help reduce pain and swelling.  A sling to help support your arm and rest your rotator cuff muscles. Long-term use of a sling is not advised. It may cause significant stiffening of the shoulder joint.  Severe tendinitis may require:  Steroid injections into your shoulder joint.  Physical therapy.  Surgery. HOME CARE INSTRUCTIONS   Apply ice to your injury:  Put ice in a plastic bag.  Place a towel between your skin and the bag.  Leave the ice on for 20 minutes, 2-3 times a day.  If you  have a shoulder immobilizer (sling and straps), wear it until told otherwise by your health care provider.  You may want to sleep on several pillows or in a recliner at night to lessen swelling and pain.  Only take over-the-counter or prescription medicines for pain, discomfort, or fever as  directed by your health care provider.  Do simple hand squeezing exercises with a soft rubber ball to decrease hand swelling. SEEK MEDICAL CARE IF:   Your shoulder pain increases, or new pain or numbness develops in your arm, hand, or fingers.  Your hand or fingers are colder than your other hand. SEEK IMMEDIATE MEDICAL CARE IF:   Your arm, hand, or fingers are numb or tingling.  Your arm, hand, or fingers are increasingly swollen and painful, or they turn white or blue. MAKE SURE YOU:  Understand these instructions.  Will watch your condition.  Will get help right away if you are not doing well or get worse.   This information is not intended to replace advice given to you by your health care provider. Make sure you discuss any questions you have with your health care provider.   Document Released: 10/11/2000 Document Revised: 10/19/2013 Document Reviewed: 05/26/2013 Elsevier Interactive Patient Education 2016 [employer]  Bicipital Tendonitis Bicipital tendonitis refers to redness, soreness, and swelling (inflammation) or irritation of the bicep tendon. The biceps muscle is located between the elbow and shoulder of the inner arm. The tendon heads, similar to pieces of rope, connect the bicep muscle to the shoulder socket. They are called short head and long head tendons. When tendonitis occurs, the long head tendon is inflamed and swollen, and may be thickened or partially torn.  Bicipital tendonitis can occur with other problems as well, such as arthritis in the shoulder or acromioclavicular joints, tears in the tendons, or other rotator cuff problems.  CAUSES  Overuse of of the arms for overhead activities is the major cause of tendonitis. Many athletes, such as swimmers, baseball players, and tennis players are prone to bicipital tendonitis. Jobs that require manual labor or routine chores, especially chores involving overhead activities can result in overuse and  tendonitis. SYMPTOMS Symptoms may include:  Pain in and around the front of the shoulder. Pain may be worse with overhead motion.  Pain or aching that radiates down the arm.  Clicking or shifting sensations in the shoulder. DIAGNOSIS Your caregiver may perform the following:  Physical exam and tests of the biceps and shoulder to observe range of motion, strength, and stability.  X-rays or magnetic resonance imaging (MRI) to confirm the diagnosis. In most common cases, these tests are not necessary. Since other problems may exist in the shoulder or rotator cuff, additional tests may be recommended. TREATMENT Treatment may include the following:  Medications  Your caregiver may prescribe over-the-counter pain relievers.  Steroid injections, such as cortisone, may be recommended. These may help to reduce inflammation and pain.  Physical Therapy - Your caregiver may recommend gentle exercises with the arm. These can help restore strength and range of motion. They may be done at home or with a physical therapist's supervision and input.  Surgery - Arthroscopic or open surgery sometimes is necessary. Surgery may include:  Reattachment or repair of the tendon at the shoulder socket.  Removal of the damaged section of the tendon.  Anchoring the tendon to a different area of the shoulder (tenodesis). HOME CARE INSTRUCTIONS   Avoid overhead motion of the affected arm or any other motion that causes  pain.  Take medication for pain as directed. Do not take these for more than 3 weeks, unless directed to do so by your caregiver.  Ice the affected area for 20 minutes at a time, 3-4 times per day. Place a towel on the skin over the painful area and the ice or cold pack over the towel. Do not place ice directly on the skin.  Perform gentle exercises at home as directed. These will increase strength and flexibility. PREVENTION  Modify your activities as much as possible to protect your  arm. A physical therapist or sports medicine physician can help you understand options for safe motion.  Avoid repetitive overhead pulling, lifting, reaching, and throwing until your caregiver tells you it is ok to resume these activities. SEEK MEDICAL CARE IF:  Your pain worsens.  You have difficulty moving the affected arm.  You have trouble performing any of the self-care instructions. MAKE SURE YOU:   Understand these instructions.  Will watch your condition.  Will get help right away if you are not doing well or get worse.   This information is not intended to replace advice given to you by your health care provider. Make sure you discuss any questions you have with your health care provider.   Document Released: 11/16/2010 Document Revised: 01/06/2012 Document Reviewed: 05/03/2015 Elsevier Interactive Patient Education [employer]

## 2015-09-21 NOTE — ED Notes (Signed)
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 Pt states he believes he tore a muscle. Pt states increased pain with movement at this time.

## 2015-11-15 ENCOUNTER — Emergency Department: Payer: Self-pay

## 2015-11-15 ENCOUNTER — Emergency Department
Admission: EM | Admit: 2015-11-15 | Discharge: 2015-11-15 | Disposition: A | Discharge status: Home / Self Care | Payer: Self-pay | Attending: Emergency Medicine | Admitting: Emergency Medicine

## 2015-11-15 ENCOUNTER — Encounter: Payer: Self-pay | Admitting: *Deleted

## 2015-11-15 DIAGNOSIS — Z791 Long term (current) use of non-steroidal anti-inflammatories (NSAID): Secondary | ICD-10-CM | POA: Insufficient documentation

## 2015-11-15 DIAGNOSIS — Z7952 Long term (current) use of systemic steroids: Secondary | ICD-10-CM | POA: Insufficient documentation

## 2015-11-15 DIAGNOSIS — Y9389 Activity, other specified: Secondary | ICD-10-CM | POA: Insufficient documentation

## 2015-11-15 DIAGNOSIS — W11XXXA Fall on and from ladder, initial encounter: Secondary | ICD-10-CM | POA: Insufficient documentation

## 2015-11-15 DIAGNOSIS — Z792 Long term (current) use of antibiotics: Secondary | ICD-10-CM | POA: Insufficient documentation

## 2015-11-15 DIAGNOSIS — Y998 Other external cause status: Secondary | ICD-10-CM | POA: Insufficient documentation

## 2015-11-15 DIAGNOSIS — F17210 Nicotine dependence, cigarettes, uncomplicated: Secondary | ICD-10-CM | POA: Insufficient documentation

## 2015-11-15 DIAGNOSIS — M62830 Muscle spasm of back: Secondary | ICD-10-CM | POA: Insufficient documentation

## 2015-11-15 DIAGNOSIS — R109 Unspecified abdominal pain: Secondary | ICD-10-CM

## 2015-11-15 DIAGNOSIS — S3991XA Unspecified injury of abdomen, initial encounter: Secondary | ICD-10-CM | POA: Insufficient documentation

## 2015-11-15 DIAGNOSIS — S299XXA Unspecified injury of thorax, initial encounter: Secondary | ICD-10-CM | POA: Insufficient documentation

## 2015-11-15 DIAGNOSIS — I10 Essential (primary) hypertension: Secondary | ICD-10-CM | POA: Insufficient documentation

## 2015-11-15 DIAGNOSIS — Y9289 Other specified places as the place of occurrence of the external cause: Secondary | ICD-10-CM | POA: Insufficient documentation

## 2015-11-15 DIAGNOSIS — S3992XA Unspecified injury of lower back, initial encounter: Secondary | ICD-10-CM | POA: Insufficient documentation

## 2015-11-15 DIAGNOSIS — Z79899 Other long term (current) drug therapy: Secondary | ICD-10-CM | POA: Insufficient documentation

## 2015-11-15 DIAGNOSIS — Z880 Allergy status to penicillin: Secondary | ICD-10-CM | POA: Insufficient documentation

## 2015-11-15 LAB — URINALYSIS COMPLETE WITH MICROSCOPIC (ARMC ONLY)
BACTERIA UA: NONE SEEN
BILIRUBIN URINE: NEGATIVE
Glucose, UA: 50 mg/dL — AB
HGB URINE DIPSTICK: NEGATIVE
Ketones, ur: NEGATIVE mg/dL
Nitrite: NEGATIVE
PROTEIN: NEGATIVE mg/dL
Specific Gravity, Urine: 1.018 (ref 1.005–1.030)
pH: 6 (ref 5.0–8.0)

## 2015-11-15 LAB — CBC WITH DIFFERENTIAL/PLATELET
BASOS ABS: 0 10*3/uL (ref 0–0.1)
Basophils Relative: 1 %
Eosinophils Absolute: 0.6 10*3/uL (ref 0–0.7)
Eosinophils Relative: 6 %
HEMATOCRIT: 47.4 % (ref 40.0–52.0)
Hemoglobin: 15.6 g/dL (ref 13.0–18.0)
LYMPHS PCT: 20 %
Lymphs Abs: 2 10*3/uL (ref 1.0–3.6)
MCH: 29 pg (ref 26.0–34.0)
MCHC: 32.9 g/dL (ref 32.0–36.0)
MCV: 88.4 fL (ref 80.0–100.0)
Monocytes Absolute: 0.8 10*3/uL (ref 0.2–1.0)
Monocytes Relative: 9 %
NEUTROS ABS: 6.5 10*3/uL (ref 1.4–6.5)
NEUTROS PCT: 64 %
Platelets: 169 10*3/uL (ref 150–440)
RBC: 5.36 MIL/uL (ref 4.40–5.90)
RDW: 14.1 % (ref 11.5–14.5)
WBC: 10 10*3/uL (ref 3.8–10.6)

## 2015-11-15 LAB — COMPREHENSIVE METABOLIC PANEL
ALBUMIN: 4 g/dL (ref 3.5–5.0)
ALT: 22 U/L (ref 17–63)
AST: 17 U/L (ref 15–41)
Alkaline Phosphatase: 52 U/L (ref 38–126)
Anion gap: 7 (ref 5–15)
BILIRUBIN TOTAL: 0.8 mg/dL (ref 0.3–1.2)
BUN: 17 mg/dL (ref 6–20)
CHLORIDE: 106 mmol/L (ref 101–111)
CO2: 26 mmol/L (ref 22–32)
CREATININE: 0.78 mg/dL (ref 0.61–1.24)
Calcium: 9.2 mg/dL (ref 8.9–10.3)
GFR calc Af Amer: >60 mL/min (ref >60)
GLUCOSE: 109 mg/dL — AB (ref 65–99)
Potassium: 3.9 mmol/L (ref 3.5–5.1)
Sodium: 139 mmol/L (ref 135–145)
TOTAL PROTEIN: 7.1 g/dL (ref 6.5–8.1)

## 2015-11-15 LAB — LIPASE, BLOOD: LIPASE: 27 U/L (ref 11–51)

## 2015-11-15 LAB — TROPONIN I: Troponin I: <0.03 ng/mL (ref <0.031)

## 2015-11-15 MED ORDER — SODIUM CHLORIDE 0.9 % IV BOLUS (SEPSIS)
1000.0000 mL | Freq: Once | INTRAVENOUS | Status: AC
  Administered 2015-11-15: 1000 mL via INTRAVENOUS

## 2015-11-15 MED ORDER — MORPHINE SULFATE (PF) 4 MG/ML IV SOLN
4.0000 mg | Freq: Once | INTRAVENOUS | Status: AC
  Administered 2015-11-15: 4 mg via INTRAVENOUS
  Filled 2015-11-15: qty 1

## 2015-11-15 MED ORDER — KETOROLAC TROMETHAMINE 30 MG/ML IJ SOLN
30.0000 mg | Freq: Once | INTRAMUSCULAR | Status: AC
  Administered 2015-11-15: 30 mg via INTRAVENOUS

## 2015-11-15 MED ORDER — MORPHINE SULFATE (PF) 4 MG/ML IV SOLN
4.0000 mg | Freq: Once | INTRAVENOUS | Status: AC
Start: 2015-11-15 — End: 2015-11-15
  Administered 2015-11-15: 4 mg via INTRAVENOUS
  Filled 2015-11-15: qty 1

## 2015-11-15 MED ORDER — HYDROCODONE-ACETAMINOPHEN 5-325 MG PO TABS: 1.0000 | ORAL_TABLET | ORAL | Status: DC | PRN

## 2015-11-15 MED ORDER — KETOROLAC TROMETHAMINE 30 MG/ML IJ SOLN
INTRAMUSCULAR | Status: AC
  Administered 2015-11-15: 30 mg via INTRAVENOUS
  Filled 2015-11-15: qty 1

## 2015-11-15 MED ORDER — DIAZEPAM 5 MG/ML IJ SOLN
5.0000 mg | Freq: Once | INTRAMUSCULAR | Status: AC
  Administered 2015-11-15: 5 mg via INTRAVENOUS
  Filled 2015-11-15: qty 2

## 2015-11-15 MED ORDER — CYCLOBENZAPRINE HCL 5 MG PO TABS
5.0000 mg | ORAL_TABLET | Freq: Three times a day (TID) | ORAL | Status: DC | PRN
Start: 2015-11-15 — End: 2015-12-07

## 2015-11-15 NOTE — Discharge Instructions (Signed)
Take motrin 800 mg every 6 hrs for pain.   Take norco for severe pain. Do not drive with it.   Take flexeril for muscle spasms.   See your doctor.   Rest for 2 days.   Return to ER if you have severe pain, vomiting.

## 2015-11-15 NOTE — ED Provider Notes (Signed)
CSN: [id number]     Arrival date & time 11/15/15  4098 History   First MD Initiated Contact with Patient 11/15/15 [phone]     Chief Complaint  Patient presents with  . Flank Pain     (Consider location/radiation/quality/duration/timing/severity/associated sxs/prior Treatment) The history is provided by the patient.  Isaiah Elting. is a 35 y.o. male hx of lyme disease, hypertension here with chest pain, back pain, abdominal pain. Patient states that he fell off from and 8 foot ladder about a month ago and landed on his back. He did not get checked out at that time and since then he has intermittent right-sided back pain and flank pain. The pain progressively got worse and for the last 3 days he was unable to sleep because of the pain. He states that the pain radiated from his back all the way to the center of his chest. He states that he has been urinating less because he has been eating and drinking less. He states that he has some nausea but no vomiting. Denies any fevers but does have some diaphoresis at night especially associated with the pain.      Past Medical History  Diagnosis Date  . H/o Lyme disease   . Hypertension    History reviewed. No pertinent past surgical history. No family history on file. Social History  Substance Use Topics  . Smoking status: Current Every Day Smoker -- 2.00 packs/day    Types: Cigars  . Smokeless tobacco: None  . Alcohol Use: No    Review of Systems  Cardiovascular: Positive for chest pain.  Gastrointestinal: Positive for abdominal pain.  Musculoskeletal: Positive for back pain.  All other systems reviewed and are negative.     Allergies  Aspirin; Penicillins; and Tegretol  Home Medications   Prior to Admission medications   Medication Sig Start Date End Date Taking? Authorizing Provider  Aspirin-Acetaminophen (GOODYS BODY PAIN PO) Take 1 Package by mouth as needed.   Yes Historical Provider, MD  HYDROcodone-acetaminophen (NORCO)  5-325 MG tablet Take 1 tablet by mouth every 6 (six) hours as needed for moderate pain. 09/21/15  Yes Evon Slack, PA-C  ibuprofen (ADVIL,MOTRIN) 200 MG tablet Take 200 mg by mouth every 6 (six) hours as needed.   Yes Historical Provider, MD  meloxicam (MOBIC) 15 MG tablet Take 1 tablet (15 mg total) by mouth daily. 09/21/15  Yes Evon Slack, PA-C  doxycycline (VIBRA-TABS) 100 MG tablet Take 1 tablet (100 mg total) by mouth 2 (two) times daily. Patient not taking: Reported on 11/15/2015 06/14/15   Myrna Blazer, MD  HYDROcodone-acetaminophen (NORCO/VICODIN) 5-325 MG per tablet Take 1 tablet by mouth every 4 (four) hours as needed for moderate pain. Patient not taking: Reported on 11/15/2015 06/07/15   Tommi Rumps, PA-C  predniSONE (DELTASONE) 10 MG tablet Take 1 tablet (10 mg total) by mouth daily. 6,5,4,3,2,1 six day taper Patient not taking: Reported on 11/15/2015 09/21/15   Evon Slack, PA-C  sulfamethoxazole-trimethoprim (BACTRIM DS,SEPTRA DS) 800-160 MG per tablet Take 1 tablet by mouth 2 (two) times daily. Patient not taking: Reported on 11/15/2015 06/07/15   Tommi Rumps, PA-C   BP 113/77 mmHg  Pulse 80  Temp(Src) 97.6 F (36.4 C) (Oral)  Resp 15  Ht  (1.88 m)  Wt 200 lb (90.719 kg)  BMI 25.67 kg/m2  SpO2 96% Physical Exam  Constitutional: He is oriented to person, place, and time.  Uncomfortable   HENT:  Head: Normocephalic.  Mouth/Throat: Oropharynx is clear and moist.  Eyes: Conjunctivae are normal. Pupils are equal, round, and reactive to light.  Neck: Normal range of motion. Neck supple.  Cardiovascular: Normal rate, regular rhythm and normal heart sounds.   Pulmonary/Chest: Effort normal and breath sounds normal. No respiratory distress. He has no wheezes.  + tenderness R posterior ribs, no obvious ecchymosis.   Abdominal: Soft. Bowel sounds are normal.  + epigastric tenderness   Musculoskeletal:  R upper paralumbar tenderness, no obvious  midline tenderness   Neurological: He is alert and oriented to person, place, and time.  Nl strength bilateral lower extremities, no saddle anesthesia   Skin: Skin is warm and dry.  Psychiatric: He has a normal mood and affect. His behavior is normal. Judgment and thought content normal.  Nursing note and vitals reviewed.   ED Course  Procedures (including critical care time) Labs Review Labs Reviewed  COMPREHENSIVE METABOLIC PANEL - Abnormal; Notable for the following:    Glucose, Bld 109 (*)    All other components within normal limits  URINALYSIS COMPLETEWITH MICROSCOPIC ([facility] ONLY) - Abnormal; Notable for the following:    Color, Urine YELLOW (*)    APPearance CLEAR (*)    Glucose, UA 50 (*)    Leukocytes, UA TRACE (*)    Squamous Epithelial / LPF 0-5 (*)    All other components within normal limits  CBC WITH DIFFERENTIAL/PLATELET  LIPASE, BLOOD  TROPONIN I    Imaging Review Dg Ribs Unilateral W/chest Right  11/15/2015  CLINICAL DATA:  Fall off platform from 8 foot height approximately 1 month ago. Persistent right rib and chest wall pain. EXAM: RIGHT RIBS AND CHEST - 3+ VIEW COMPARISON:  12/27/2013 FINDINGS: No fracture or other bone lesions are seen involving the ribs. There is no evidence of pneumothorax or pleural effusion. Both lungs are clear. Heart size and mediastinal contours are within normal limits. IMPRESSION: Negative. Electronically Signed   By: Myles Rosenthal M.D.   On: 11/15/2015 10:37   Dg Lumbar Spine Complete  11/15/2015  CLINICAL DATA:  Fall from a platform 6 weeks ago with right rib pain and back pain. EXAM: LUMBAR SPINE - COMPLETE 4+ VIEW COMPARISON:  11/15/2015 and 08/06/2013 FINDINGS: A transitional lumbosacral vertebra is assumed to represent the S1 level. Careful correlation with this numbering strategy prior to any procedural intervention would be recommended. No lumbar spine fracture or malalignment is identified. No significant facet arthropathy. No  acute lumbar spine findings. IMPRESSION: 1.  No significant abnormality identified. Electronically Signed   By: Gaylyn Rong M.D.   On: 11/15/2015 10:37   Dg Abd 2 Views  11/15/2015  CLINICAL DATA:  Fall off platform from 8 foot height approximately 2 months ago. Abdominal pain. EXAM: ABDOMEN - 2 VIEW COMPARISON:  None. FINDINGS: The bowel gas pattern is normal. There is no evidence of free air. No radio-opaque calculi or other significant radiographic abnormality is seen. Visualized skeletal structures are unremarkable. IMPRESSION: Negative. Electronically Signed   By: Myles Rosenthal M.D.   On: 11/15/2015 10:38   I have personally reviewed and evaluated these images and lab results as part of my medical decision-making.   EKG Interpretation None       ED ECG REPORT I, Destinee Taber, the attending physician, personally viewed and interpreted this ECG.   Date: 11/15/2015  EKG Time: 11:06  Rate: 73  Rhythm: normal EKG, normal sinus rhythm  Axis: normal  Intervals:none  ST&T Change: J point  MDM   Final diagnoses:  Abdominal pain    Isaiah Stockert. is a 35 y.o. male here with back pain, rib pain, chest pain for a month. I think likely rib injury vs renal colic vs back strain, less likely ACS or dissection. Patient tachycardic but in pain. Will check labs, rib and back xrays, UA. Will give pain meds and reassess.   2:11 PM Tachycardia resolved after given pain meds. UA and xrays unremarkable. Pain improved. Likely muscle spasms. Will dc home with norco, flexeril, motrin.    Richardean Canal, MD 11/15/15 [phone]

## 2015-11-15 NOTE — ED Notes (Signed)
Discussed discharge instructions, prescriptions, and follow-up care with patient. No questions or concerns at this time. Pt stable at discharge.  

## 2015-11-15 NOTE — ED Notes (Signed)
Hx fall last month has pain right mid back pain radiates to chest and groin, decrease urine

## 2015-12-05 ENCOUNTER — Emergency Department: Payer: Worker's Compensation

## 2015-12-05 ENCOUNTER — Encounter: Payer: Self-pay | Admitting: Medical Oncology

## 2015-12-05 ENCOUNTER — Emergency Department
Admission: EM | Admit: 2015-12-05 | Discharge: 2015-12-05 | Disposition: A | Discharge status: Home / Self Care | Payer: Worker's Compensation | Attending: Emergency Medicine | Admitting: Emergency Medicine

## 2015-12-05 DIAGNOSIS — S065X9A Traumatic subdural hemorrhage with loss of consciousness of unspecified duration, initial encounter: Secondary | ICD-10-CM

## 2015-12-05 DIAGNOSIS — W19XXXA Unspecified fall, initial encounter: Secondary | ICD-10-CM

## 2015-12-05 DIAGNOSIS — Z880 Allergy status to penicillin: Secondary | ICD-10-CM | POA: Insufficient documentation

## 2015-12-05 DIAGNOSIS — S060X1A Concussion with loss of consciousness of 30 minutes or less, initial encounter: Secondary | ICD-10-CM | POA: Diagnosis not present

## 2015-12-05 DIAGNOSIS — I10 Essential (primary) hypertension: Secondary | ICD-10-CM | POA: Diagnosis not present

## 2015-12-05 DIAGNOSIS — S39012A Strain of muscle, fascia and tendon of lower back, initial encounter: Secondary | ICD-10-CM | POA: Insufficient documentation

## 2015-12-05 DIAGNOSIS — F1110 Opioid abuse, uncomplicated: Secondary | ICD-10-CM | POA: Diagnosis not present

## 2015-12-05 DIAGNOSIS — Y9289 Other specified places as the place of occurrence of the external cause: Secondary | ICD-10-CM | POA: Diagnosis not present

## 2015-12-05 DIAGNOSIS — W1789XA Other fall from one level to another, initial encounter: Secondary | ICD-10-CM | POA: Diagnosis not present

## 2015-12-05 DIAGNOSIS — S0101XA Laceration without foreign body of scalp, initial encounter: Secondary | ICD-10-CM | POA: Insufficient documentation

## 2015-12-05 DIAGNOSIS — S3992XA Unspecified injury of lower back, initial encounter: Secondary | ICD-10-CM | POA: Insufficient documentation

## 2015-12-05 DIAGNOSIS — F17210 Nicotine dependence, cigarettes, uncomplicated: Secondary | ICD-10-CM | POA: Insufficient documentation

## 2015-12-05 DIAGNOSIS — S065XAA Traumatic subdural hemorrhage with loss of consciousness status unknown, initial encounter: Secondary | ICD-10-CM

## 2015-12-05 DIAGNOSIS — Y9389 Activity, other specified: Secondary | ICD-10-CM | POA: Diagnosis not present

## 2015-12-05 DIAGNOSIS — M79662 Pain in left lower leg: Secondary | ICD-10-CM | POA: Insufficient documentation

## 2015-12-05 DIAGNOSIS — Y998 Other external cause status: Secondary | ICD-10-CM | POA: Insufficient documentation

## 2015-12-05 DIAGNOSIS — S065X1A Traumatic subdural hemorrhage with loss of consciousness of 30 minutes or less, initial encounter: Secondary | ICD-10-CM | POA: Insufficient documentation

## 2015-12-05 DIAGNOSIS — S0990XA Unspecified injury of head, initial encounter: Secondary | ICD-10-CM | POA: Diagnosis present

## 2015-12-05 LAB — URINE DRUG SCREEN, QUALITATIVE (ARMC ONLY)
Amphetamines, Ur Screen: NOT DETECTED
Barbiturates, Ur Screen: NOT DETECTED
Benzodiazepine, Ur Scrn: NOT DETECTED
CANNABINOID 50 NG, UR ~~LOC~~: NOT DETECTED
COCAINE METABOLITE, UR ~~LOC~~: NOT DETECTED
MDMA (ECSTASY) UR SCREEN: NOT DETECTED
Methadone Scn, Ur: NOT DETECTED
OPIATE, UR SCREEN: POSITIVE — AB
PHENCYCLIDINE (PCP) UR S: NOT DETECTED
Tricyclic, Ur Screen: NOT DETECTED

## 2015-12-05 LAB — CBC WITH DIFFERENTIAL/PLATELET
BASOS ABS: 0 10*3/uL (ref 0–0.1)
BASOS PCT: 0 %
EOS ABS: 0.1 10*3/uL (ref 0–0.7)
EOS PCT: 1 %
HCT: 43.1 % (ref 40.0–52.0)
HEMOGLOBIN: 14.1 g/dL (ref 13.0–18.0)
Lymphocytes Relative: 7 %
Lymphs Abs: 1 10*3/uL (ref 1.0–3.6)
MCH: 28.8 pg (ref 26.0–34.0)
MCHC: 32.8 g/dL (ref 32.0–36.0)
MCV: 87.6 fL (ref 80.0–100.0)
MONO ABS: 0.8 10*3/uL (ref 0.2–1.0)
Monocytes Relative: 5 %
Neutro Abs: 13.7 10*3/uL — ABNORMAL HIGH (ref 1.4–6.5)
Neutrophils Relative %: 87 %
PLATELETS: 192 10*3/uL (ref 150–440)
RBC: 4.91 MIL/uL (ref 4.40–5.90)
RDW: 13.9 % (ref 11.5–14.5)
WBC: 15.7 10*3/uL — ABNORMAL HIGH (ref 3.8–10.6)

## 2015-12-05 LAB — COMPREHENSIVE METABOLIC PANEL
ALT: 19 U/L (ref 17–63)
AST: 25 U/L (ref 15–41)
Albumin: 4.2 g/dL (ref 3.5–5.0)
Alkaline Phosphatase: 52 U/L (ref 38–126)
Anion gap: 8 (ref 5–15)
BUN: 12 mg/dL (ref 6–20)
CHLORIDE: 110 mmol/L (ref 101–111)
CO2: 22 mmol/L (ref 22–32)
CREATININE: 0.89 mg/dL (ref 0.61–1.24)
Calcium: 9 mg/dL (ref 8.9–10.3)
GFR calc Af Amer: >60 mL/min (ref >60)
GFR calc non Af Amer: >60 mL/min (ref >60)
GLUCOSE: 94 mg/dL (ref 65–99)
Potassium: 4.2 mmol/L (ref 3.5–5.1)
SODIUM: 140 mmol/L (ref 135–145)
Total Bilirubin: 0.5 mg/dL (ref 0.3–1.2)
Total Protein: 7.1 g/dL (ref 6.5–8.1)

## 2015-12-05 LAB — ETHANOL: Alcohol, Ethyl (B): <5 mg/dL (ref <5)

## 2015-12-05 MED ORDER — OXYCODONE-ACETAMINOPHEN 7.5-325 MG PO TABS: 1.0000 | ORAL_TABLET | ORAL | Status: DC | PRN

## 2015-12-05 MED ORDER — HYDROMORPHONE HCL 1 MG/ML IJ SOLN
1.0000 mg | Freq: Once | INTRAMUSCULAR | Status: AC
  Administered 2015-12-05: 1 mg via INTRAVENOUS
  Filled 2015-12-05: qty 1

## 2015-12-05 NOTE — ED Notes (Signed)
Patient transported to CT 

## 2015-12-05 NOTE — ED Notes (Signed)
Pt reports he was at work and fell off of a 8 ft high platform. Lac to back of head, neck pain, back pain, hip pain and bilt leg pain. Pt reports Loss consciousness.

## 2015-12-05 NOTE — ED Provider Notes (Signed)
[facility] Emergency Department Provider Note     Time seen: ----------------------------------------- 12:28 PM on 12/05/2015 -----------------------------------------    I have reviewed the triage vital signs and the nursing notes.   HISTORY  Chief Complaint Fall; Head Injury; Hip Pain; Back Pain; and Leg Pain    HPI Isaiah Green. is a 35 y.o. male who presents ER after a fall from an 8 foot platform. Patient sustained laceration to back to his head, complains of back pain and left leg pain. He reports loss of consciousness. He denies any recent illness, main complaints are low back pain so severe that he can't walk.He reports pain in his low back is severe.   Past Medical History  Diagnosis Date  . H/o Lyme disease   . Hypertension     There are no active problems to display for this patient.   History reviewed. No pertinent past surgical history.  Allergies Aspirin; Penicillins; and Tegretol  Social History Social History  Substance Use Topics  . Smoking status: Current Every Day Smoker -- 2.00 packs/day    Types: Cigars  . Smokeless tobacco: None  . Alcohol Use: No    Review of Systems Constitutional: Negative for fever. Eyes: Negative for visual changes. ENT: Negative for sore throat. Cardiovascular: Negative for chest pain. Respiratory: Negative for shortness of breath. Gastrointestinal: Negative for abdominal pain, vomiting and diarrhea. Genitourinary: Negative for dysuria. Musculoskeletal: Positive for severe low back pain Skin: Positive for posterior scalp laceration Neurological: Positive for headache and weakness  10-point ROS otherwise negative.  ____________________________________________   PHYSICAL EXAM:  VITAL SIGNS: ED Triage Vitals  Enc Vitals Group     BP 12/05/15 1149 161/93 mmHg     Pulse Rate 12/05/15 1149 94     Resp 12/05/15 1149 20     Temp 12/05/15 1149 97.6 F (36.4 C)     Temp Source  12/05/15 1149 Oral     SpO2 12/05/15 1149 98 %     Weight 12/05/15 1149 195 lb (88.451 kg)     Height 12/05/15 1149  (1.88 m)     Head Cir --      Peak Flow --      Pain Score 12/05/15 1155 10     Pain Loc --      Pain Edu? --      Excl. in GC? --     Constitutional: Alert and oriented. Mild to moderate distress Eyes: Conjunctivae are normal. PERRL. Normal extraocular movements. ENT   Head: 4 cm scalp laceration noted in the occiput, midline   Nose: No congestion/rhinnorhea.   Mouth/Throat: Mucous membranes are moist.   Neck: No stridor. Cardiovascular: Normal rate, regular rhythm. Normal and symmetric distal pulses are present in all extremities. No murmurs, rubs, or gallops. Respiratory: Normal respiratory effort without tachypnea nor retractions. Breath sounds are clear and equal bilaterally. No wheezes/rales/rhonchi. Gastrointestinal: Soft and nontender. No distention. No abdominal bruits.  Musculoskeletal: Back pain elicited with range of motion of the lower extremities. Lumbar spine tenderness in the lower lumbar spine in the midline Neurologic:  Normal speech and language. No gross focal neurologic deficits are appreciated.  Skin: 4 cm scalp laceration as noted midline Psychiatric: Mood and affect are normal. Speech and behavior are normal. Patient exhibits appropriate insight and judgment. ____________________________________________  ED COURSE:  Pertinent labs & imaging results that were available during my care of the patient were reviewed by me and considered in my medical decision making (  see chart for details). We'll obtain CT imaging, basic x-rays and he will need staple repair of his wound.   LACERATION REPAIR Performed by: Emily Filbert Authorized by: Daryel November E Consent: Verbal consent obtained. Risks and benefits: risks, benefits and alternatives were discussed Consent given by: patient Patient identity confirmed: provided  demographic data Prepped and Draped in normal sterile fashion Wound explored   Laceration Location : scalp Laceration Length: 4 cm  No Foreign Bodies seen or palpated  Irrigation method: syringe Amount of cleaning: standard  Skin closure: Staples   Number of staples: 5   Technique: Standard interrupted   Patient tolerance: Patient tolerated the procedure well with no immediate complications. ____________________________________________    LABS (pertinent positives/negatives)  Labs Reviewed  CBC WITH DIFFERENTIAL/PLATELET - Abnormal; Notable for the following:    WBC 15.7 (*)    Neutro Abs 13.7 (*)    All other components within normal limits  URINE DRUG SCREEN, QUALITATIVE ([facility] ONLY) - Abnormal; Notable for the following:    Opiate, Ur Screen POSITIVE (*)    All other components within normal limits  COMPREHENSIVE METABOLIC PANEL  ETHANOL    RADIOLOGY Images were viewed by me  CT head, lumbar spine, left forearm, left tib-fib IMPRESSION: Normal exam.  IMPRESSION: Stable lumbar spine radiographs. No evidence of acute injury.  IMPRESSION: No acute osseous abnormalities.  IMPRESSION: 1. Small amount of subdural hemorrhage noted along the left anterior falx cerebri. No other acute finding. No other abnormality. No skull fracture. ____________________________________________  FINAL ASSESSMENT AND PLAN  Fall, scalp laceration, low back pain, small subdural hematoma, muscle strain  Plan: Patient with labs and imaging as dictated above. Wound was repaired as dictated above, I have discussed with neurosurgery on call who states this is an nonsignificant CT finding. He does not need to return for follow-up imaging. He will be given pain medicine and muscle relaxants. He will be referred to his local doctor in 2 days for recheck.   Emily Filbert, MD   Emily Filbert, MD 12/05/15 [phone]

## 2015-12-05 NOTE — ED Notes (Signed)
Resumed care from [facility] - Pt alert and oriented - Dr Mayford Knife in with pt

## 2015-12-05 NOTE — ED Notes (Signed)
Attempted to call patient's place of work to collect worker's comp instructions.  No response.

## 2015-12-05 NOTE — Discharge Instructions (Signed)
Concussion, Adult A concussion, or closed-head injury, is a brain injury caused by a direct blow to the head or by a quick and sudden movement (jolt) of the head or neck. Concussions are usually not life-threatening. Even so, the effects of a concussion can be serious. If you have had a concussion before, you are more likely to experience concussion-like symptoms after a direct blow to the head.  CAUSES  Direct blow to the head, such as from running into another player during a soccer game, being hit in a fight, or hitting your head on a hard surface.  A jolt of the head or neck that causes the brain to move back and forth inside the skull, such as in a car crash. SIGNS AND SYMPTOMS The signs of a concussion can be hard to notice. Early on, they may be missed by you, family members, and health care providers. You may look fine but act or feel differently. Symptoms are usually temporary, but they may last for days, weeks, or even longer. Some symptoms may appear right away while others may not show up for hours or days. Every head injury is different. Symptoms include:  Mild to moderate headaches that will not go away.  A feeling of pressure inside your head.  Having more trouble than usual:  Learning or remembering things you have heard.  Answering questions.  Paying attention or concentrating.  Organizing daily tasks.  Making decisions and solving problems.  Slowness in thinking, acting or reacting, speaking, or reading.  Getting lost or being easily confused.  Feeling tired all the time or lacking energy (fatigued).  Feeling drowsy.  Sleep disturbances.  Sleeping more than usual.  Sleeping less than usual.  Trouble falling asleep.  Trouble sleeping (insomnia).  Loss of balance or feeling lightheaded or dizzy.  Nausea or vomiting.  Numbness or tingling.  Increased sensitivity to:  Sounds.  Lights.  Distractions.  Vision problems or eyes that tire  easily.  Diminished sense of taste or smell.  Ringing in the ears.  Mood changes such as feeling sad or anxious.  Becoming easily irritated or angry for little or no reason.  Lack of motivation.  Seeing or hearing things other people do not see or hear (hallucinations). DIAGNOSIS Your health care provider can usually diagnose a concussion based on a description of your injury and symptoms. He or she will ask whether you passed out (lost consciousness) and whether you are having trouble remembering events that happened right before and during your injury. Your evaluation might include:  A brain scan to look for signs of injury to the brain. Even if the test shows no injury, you may still have a concussion.  Blood tests to be sure other problems are not present. TREATMENT  Concussions are usually treated in an emergency department, in urgent care, or at a clinic. You may need to stay in the hospital overnight for further treatment.  Tell your health care provider if you are taking any medicines, including prescription medicines, over-the-counter medicines, and natural remedies. Some medicines, such as blood thinners (anticoagulants) and aspirin, may increase the chance of complications. Also tell your health care provider whether you have had alcohol or are taking illegal drugs. This information may affect treatment.  Your health care provider will send you home with important instructions to follow.  How fast you will recover from a concussion depends on many factors. These factors include how severe your concussion is, what part of your brain was injured,  your age, and how healthy you were before the concussion.  Most people with mild injuries recover fully. Recovery can take time. In general, recovery is slower in older persons. Also, persons who have had a concussion in the past or have other medical problems may find that it takes longer to recover from their current injury. HOME  CARE INSTRUCTIONS General Instructions  Carefully follow the directions your health care provider gave you.  Only take over-the-counter or prescription medicines for pain, discomfort, or fever as directed by your health care provider.  Take only those medicines that your health care provider has approved.  Do not drink alcohol until your health care provider says you are well enough to do so. Alcohol and certain other drugs may slow your recovery and can put you at risk of further injury.  If it is harder than usual to remember things, write them down.  If you are easily distracted, try to do one thing at a time. For example, do not try to watch TV while fixing dinner.  Talk with family members or close friends when making important decisions.  Keep all follow-up appointments. Repeated evaluation of your symptoms is recommended for your recovery.  Watch your symptoms and tell others to do the same. Complications sometimes occur after a concussion. Older adults with a brain injury may have a higher risk of serious complications, such as a blood clot on the brain.  Tell your teachers, school nurse, school counselor, coach, athletic trainer, or work Freight forwarder about your injury, symptoms, and restrictions. Tell them about what you can or cannot do. They should watch for:  Increased problems with attention or concentration.  Increased difficulty remembering or learning new information.  Increased time needed to complete tasks or assignments.  Increased irritability or decreased ability to cope with stress.  Increased symptoms.  Rest. Rest helps the brain to heal. Make sure you:  Get plenty of sleep at night. Avoid staying up late at night.  Keep the same bedtime hours on weekends and weekdays.  Rest during the day. Take daytime naps or rest breaks when you feel tired.  Limit activities that require a lot of thought or concentration. These include:  Doing homework or job-related  work.  Watching TV.  Working on the computer.  Avoid any situation where there is potential for another head injury (football, hockey, soccer, basketball, martial arts, downhill snow sports and horseback riding). Your condition will get worse every time you experience a concussion. You should avoid these activities until you are evaluated by the appropriate follow-up health care providers. Returning To Your Regular Activities You will need to return to your normal activities slowly, not all at once. You must give your body and brain enough time for recovery.  Do not return to sports or other athletic activities until your health care provider tells you it is safe to do so.  Ask your health care provider when you can drive, ride a bicycle, or operate heavy machinery. Your ability to react may be slower after a brain injury. Never do these activities if you are dizzy.  Ask your health care provider about when you can return to work or school. Preventing Another Concussion It is very important to avoid another brain injury, especially before you have recovered. In rare cases, another injury can lead to permanent brain damage, brain swelling, or death. The risk of this is greatest during the first 7-10 days after a head injury. Avoid injuries by:  Wearing a  seat belt when riding in a car.  Drinking alcohol only in moderation.  Wearing a helmet when biking, skiing, skateboarding, skating, or doing similar activities.  Avoiding activities that could lead to a second concussion, such as contact or recreational sports, until your health care provider says it is okay.  Taking safety measures in your home.  Remove clutter and tripping hazards from floors and stairways.  Use grab bars in bathrooms and handrails by stairs.  Place non-slip mats on floors and in bathtubs.  Improve lighting in dim areas. SEEK MEDICAL CARE IF:  You have increased problems paying attention or  concentrating.  You have increased difficulty remembering or learning new information.  You need more time to complete tasks or assignments than before.  You have increased irritability or decreased ability to cope with stress.  You have more symptoms than before. Seek medical care if you have any of the following symptoms for more than 2 weeks after your injury:  Lasting (chronic) headaches.  Dizziness or balance problems.  Nausea.  Vision problems.  Increased sensitivity to noise or light.  Depression or mood swings.  Anxiety or irritability.  Memory problems.  Difficulty concentrating or paying attention.  Sleep problems.  Feeling tired all the time. SEEK IMMEDIATE MEDICAL CARE IF:  You have severe or worsening headaches. These may be a sign of a blood clot in the brain.  You have weakness (even if only in one hand, leg, or part of the face).  You have numbness.  You have decreased coordination.  You vomit repeatedly.  You have increased sleepiness.  One pupil is larger than the other.  You have convulsions.  You have slurred speech.  You have increased confusion. This may be a sign of a blood clot in the brain.  You have increased restlessness, agitation, or irritability.  You are unable to recognize people or places.  You have neck pain.  It is difficult to wake you up.  You have unusual behavior changes.  You lose consciousness. MAKE SURE YOU:  Understand these instructions.  Will watch your condition.  Will get help right away if you are not doing well or get worse.   This information is not intended to replace advice given to you by your health care provider. Make sure you discuss any questions you have with your health care provider.   Document Released: 01/04/2004 Document Revised: 11/04/2014 Document Reviewed: 05/06/2013 [employer] Interactive Patient Education 2016 [employer]  Laceration Care, Adult A laceration is a cut  that goes through all of the layers of the skin and into the tissue that is right under the skin. Some lacerations heal on their own. Others need to be closed with stitches (sutures), staples, skin adhesive strips, or skin glue. Proper laceration care minimizes the risk of infection and helps the laceration to heal better. HOW TO CARE FOR YOUR LACERATION If sutures or staples were used:  Keep the wound clean and dry.  If you were given a bandage (dressing), you should change it at least one time per day or as told by your health care provider. You should also change it if it becomes wet or dirty.  Keep the wound completely dry for the first 24 hours or as told by your health care provider. After that time, you may shower or bathe. However, make sure that the wound is not soaked in water until after the sutures or staples have been removed.  Clean the wound one time each day  or as told by your health care provider:  Wash the wound with soap and water.  Rinse the wound with water to remove all soap.  Pat the wound dry with a clean towel. Do not rub the wound.  After cleaning the wound, apply a thin layer of antibiotic ointmentas told by your health care provider. This will help to prevent infection and keep the dressing from sticking to the wound.  Have the sutures or staples removed as told by your health care provider. If skin adhesive strips were used:  Keep the wound clean and dry.  If you were given a bandage (dressing), you should change it at least one time per day or as told by your health care provider. You should also change it if it becomes dirty or wet.  Do not get the skin adhesive strips wet. You may shower or bathe, but be careful to keep the wound dry.  If the wound gets wet, pat it dry with a clean towel. Do not rub the wound.  Skin adhesive strips fall off on their own. You may trim the strips as the wound heals. Do not remove skin adhesive strips that are still stuck  to the wound. They will fall off in time. If skin glue was used:  Try to keep the wound dry, but you may briefly wet it in the shower or bath. Do not soak the wound in water, such as by swimming.  After you have showered or bathed, gently pat the wound dry with a clean towel. Do not rub the wound.  Do not do any activities that will make you sweat heavily until the skin glue has fallen off on its own.  Do not apply liquid, cream, or ointment medicine to the wound while the skin glue is in place. Using those may loosen the film before the wound has healed.  If you were given a bandage (dressing), you should change it at least one time per day or as told by your health care provider. You should also change it if it becomes dirty or wet.  If a dressing is placed over the wound, be careful not to apply tape directly over the skin glue. Doing that may cause the glue to be pulled off before the wound has healed.  Do not pick at the glue. The skin glue usually remains in place for 5-10 days, then it falls off of the skin. General Instructions  Take over-the-counter and prescription medicines only as told by your health care provider.  If you were prescribed an antibiotic medicine or ointment, take or apply it as told by your doctor. Do not stop using it even if your condition improves.  To help prevent scarring, make sure to cover your wound with sunscreen whenever you are outside after stitches are removed, after adhesive strips are removed, or when glue remains in place and the wound is healed. Make sure to wear a sunscreen of at least 30 SPF.  Do not scratch or pick at the wound.  Keep all follow-up visits as told by your health care provider. This is important.  Check your wound every day for signs of infection. Watch for:  Redness, swelling, or pain.  Fluid, blood, or pus.  Raise (elevate) the injured area above the level of your heart while you are sitting or lying down, if  possible. SEEK MEDICAL CARE IF:  You received a tetanus shot and you have swelling, severe pain, redness, or bleeding at the  injection site.  You have a fever.  A wound that was closed breaks open.  You notice a bad smell coming from your wound or your dressing.  You notice something coming out of the wound, such as wood or glass.  Your pain is not controlled with medicine.  You have increased redness, swelling, or pain at the site of your wound.  You have fluid, blood, or pus coming from your wound.  You notice a change in the color of your skin near your wound.  You need to change the dressing frequently due to fluid, blood, or pus draining from the wound.  You develop a new rash.  You develop numbness around the wound. SEEK IMMEDIATE MEDICAL CARE IF:  You develop severe swelling around the wound.  Your pain suddenly increases and is severe.  You develop painful lumps near the wound or on skin that is anywhere on your body.  You have a red streak going away from your wound.  The wound is on your hand or foot and you cannot properly move a finger or toe.  The wound is on your hand or foot and you notice that your fingers or toes look pale or bluish.   This information is not intended to replace advice given to you by your health care provider. Make sure you discuss any questions you have with your health care provider.   Document Released: 10/14/2005 Document Revised: 02/28/2015 Document Reviewed: 10/10/2014 [employer] Interactive Patient Education 2016 [employer]  Low Back Sprain With Rehab A sprain is an injury in which a ligament is torn. The ligaments of the lower back are vulnerable to sprains. However, they are strong and require great force to be injured. These ligaments are important for stabilizing the spinal column. Sprains are classified into three categories. Grade 1 sprains cause pain, but the tendon is not lengthened. Grade 2 sprains include a lengthened  ligament, due to the ligament being stretched or partially ruptured. With grade 2 sprains there is still function, although the function may be decreased. Grade 3 sprains involve a complete tear of the tendon or muscle, and function is usually impaired. SYMPTOMS   Severe pain in the lower back.  Sometimes, a feeling of a "pop," "snap," or tear, at the time of injury.  Tenderness and sometimes swelling at the injury site.  Uncommonly, bruising (contusion) within 48 hours of injury.  Muscle spasms in the back. CAUSES  Low back sprains occur when a force is placed on the ligaments that is greater than they can handle. Common causes of injury include:  Performing a stressful act while off-balance.  Repetitive stressful activities that involve movement of the lower back.  Direct hit (trauma) to the lower back. RISK INCREASES WITH:  Contact sports (football, wrestling).  Collisions (major skiing accidents).  Sports that require throwing or lifting (baseball, weightlifting).  Sports involving twisting of the spine (gymnastics, diving, tennis, golf).  Poor strength and flexibility.  Inadequate protection.  Previous back injury or surgery (especially fusion). PREVENTION  Wear properly fitted and padded protective equipment.  Warm up and stretch properly before activity.  Allow for adequate recovery between workouts.  Maintain physical fitness:  Strength, flexibility, and endurance.  Cardiovascular fitness.  Maintain a healthy body weight. PROGNOSIS  If treated properly, low back sprains usually heal with non-surgical treatment. The length of time for healing depends on the severity of the injury.  RELATED COMPLICATIONS   Recurring symptoms, resulting in a chronic problem.  Chronic  inflammation and pain in the low back.  Delayed healing or resolution of symptoms, especially if activity is resumed too soon.  Prolonged impairment.  Unstable or arthritic joints of the  low back. TREATMENT  Treatment first involves the use of ice and medicine, to reduce pain and inflammation. The use of strengthening and stretching exercises may help reduce pain with activity. These exercises may be performed at home or with a therapist. Severe injuries may require referral to a therapist for further evaluation and treatment, such as ultrasound. Your caregiver may advise that you wear a back brace or corset, to help reduce pain and discomfort. Often, prolonged bed rest results in greater harm then benefit. Corticosteroid injections may be recommended. However, these should be reserved for the most serious cases. It is important to avoid using your back when lifting objects. At night, sleep on your back on a firm mattress, with a pillow placed under your knees. If non-surgical treatment is unsuccessful, surgery may be needed.  MEDICATION   If pain medicine is needed, nonsteroidal anti-inflammatory medicines (aspirin and ibuprofen), or other minor pain relievers (acetaminophen), are often advised.  Do not take pain medicine for 7 days before surgery.  Prescription pain relievers may be given, if your caregiver thinks they are needed. Use only as directed and only as much as you need.  Ointments applied to the skin may be helpful.  Corticosteroid injections may be given by your caregiver. These injections should be reserved for the most serious cases, because they may only be given a certain number of times. HEAT AND COLD  Cold treatment (icing) should be applied for 10 to 15 minutes every 2 to 3 hours for inflammation and pain, and immediately after activity that aggravates your symptoms. Use ice packs or an ice massage.  Heat treatment may be used before performing stretching and strengthening activities prescribed by your caregiver, physical therapist, or athletic trainer. Use a heat pack or a warm water soak. SEEK MEDICAL CARE IF:   Symptoms get worse or do not improve in 2 to  4 weeks, despite treatment.  You develop numbness or weakness in either leg.  You lose bowel or bladder function.  Any of the following occur after surgery: fever, increased pain, swelling, redness, drainage of fluids, or bleeding in the affected area.  New, unexplained symptoms develop. (Drugs used in treatment may produce side effects.) EXERCISES  RANGE OF MOTION (ROM) AND STRETCHING EXERCISES - Low Back Sprain Most people with lower back pain will find that their symptoms get worse with excessive bending forward (flexion) or arching at the lower back (extension). The exercises that will help resolve your symptoms will focus on the opposite motion.  Your physician, physical therapist or athletic trainer will help you determine which exercises will be most helpful to resolve your lower back pain. Do not complete any exercises without first consulting with your caregiver. Discontinue any exercises which make your symptoms worse, until you speak to your caregiver. If you have pain, numbness or tingling which travels down into your buttocks, leg or foot, the goal of the therapy is for these symptoms to move closer to your back and eventually resolve. Sometimes, these leg symptoms will get better, but your lower back pain may worsen. This is often an indication of progress in your rehabilitation. Be very alert to any changes in your symptoms and the activities in which you participated in the 24 hours prior to the change. Sharing this information with your caregiver will  allow him or her to most efficiently treat your condition. These exercises may help you when beginning to rehabilitate your injury. Your symptoms may resolve with or without further involvement from your physician, physical therapist or athletic trainer. While completing these exercises, remember:   Restoring tissue flexibility helps normal motion to return to the joints. This allows healthier, less painful movement and activity.  An  effective stretch should be held for at least 30 seconds.  A stretch should never be painful. You should only feel a gentle lengthening or release in the stretched tissue. FLEXION RANGE OF MOTION AND STRETCHING EXERCISES: STRETCH - Flexion, Single Knee to Chest   Lie on a firm bed or floor with both legs extended in front of you.  Keeping one leg in contact with the floor, bring your opposite knee to your chest. Hold your leg in place by either grabbing behind your thigh or at your knee.  Pull until you feel a gentle stretch in your low back. Hold __________ seconds.  Slowly release your grasp and repeat the exercise with the opposite side. Repeat __________ times. Complete this exercise __________ times per day.  STRETCH - Flexion, Double Knee to Chest  Lie on a firm bed or floor with both legs extended in front of you.  Keeping one leg in contact with the floor, bring your opposite knee to your chest.  Tense your stomach muscles to support your back and then lift your other knee to your chest. Hold your legs in place by either grabbing behind your thighs or at your knees.  Pull both knees toward your chest until you feel a gentle stretch in your low back. Hold __________ seconds.  Tense your stomach muscles and slowly return one leg at a time to the floor. Repeat __________ times. Complete this exercise __________ times per day.  STRETCH - Low Trunk Rotation  Lie on a firm bed or floor. Keeping your legs in front of you, bend your knees so they are both pointed toward the ceiling and your feet are flat on the floor.  Extend your arms out to the side. This will stabilize your upper body by keeping your shoulders in contact with the floor.  Gently and slowly drop both knees together to one side until you feel a gentle stretch in your low back. Hold for __________ seconds.  Tense your stomach muscles to support your lower back as you bring your knees back to the starting position.  Repeat the exercise to the other side. Repeat __________ times. Complete this exercise __________ times per day  EXTENSION RANGE OF MOTION AND FLEXIBILITY EXERCISES: STRETCH - Extension, Prone on Elbows   Lie on your stomach on the floor, a bed will be too soft. Place your palms about shoulder width apart and at the height of your head.  Place your elbows under your shoulders. If this is too painful, stack pillows under your chest.  Allow your body to relax so that your hips drop lower and make contact more completely with the floor.  Hold this position for __________ seconds.  Slowly return to lying flat on the floor. Repeat __________ times. Complete this exercise __________ times per day.  RANGE OF MOTION - Extension, Prone Press Ups  Lie on your stomach on the floor, a bed will be too soft. Place your palms about shoulder width apart and at the height of your head.  Keeping your back as relaxed as possible, slowly straighten your elbows while keeping your  hips on the floor. You may adjust the placement of your hands to maximize your comfort. As you gain motion, your hands will come more underneath your shoulders.  Hold this position __________ seconds.  Slowly return to lying flat on the floor. Repeat __________ times. Complete this exercise __________ times per day.  RANGE OF MOTION- Quadruped, Neutral Spine   Assume a hands and knees position on a firm surface. Keep your hands under your shoulders and your knees under your hips. You may place padding under your knees for comfort.  Drop your head and point your tailbone toward the ground below you. This will round out your lower back like an angry cat. Hold this position for __________ seconds.  Slowly lift your head and release your tail bone so that your back sags into a large arch, like an old horse.  Hold this position for __________ seconds.  Repeat this until you feel limber in your low back.  Now, find your "sweet  spot." This will be the most comfortable position somewhere between the two previous positions. This is your neutral spine. Once you have found this position, tense your stomach muscles to support your low back.  Hold this position for __________ seconds. Repeat __________ times. Complete this exercise __________ times per day.  STRENGTHENING EXERCISES - Low Back Sprain These exercises may help you when beginning to rehabilitate your injury. These exercises should be done near your "sweet spot." This is the neutral, low-back arch, somewhere between fully rounded and fully arched, that is your least painful position. When performed in this safe range of motion, these exercises can be used for people who have either a flexion or extension based injury. These exercises may resolve your symptoms with or without further involvement from your physician, physical therapist or athletic trainer. While completing these exercises, remember:   Muscles can gain both the endurance and the strength needed for everyday activities through controlled exercises.  Complete these exercises as instructed by your physician, physical therapist or athletic trainer. Increase the resistance and repetitions only as guided.  You may experience muscle soreness or fatigue, but the pain or discomfort you are trying to eliminate should never worsen during these exercises. If this pain does worsen, stop and make certain you are following the directions exactly. If the pain is still present after adjustments, discontinue the exercise until you can discuss the trouble with your caregiver. STRENGTHENING - Deep Abdominals, Pelvic Tilt   Lie on a firm bed or floor. Keeping your legs in front of you, bend your knees so they are both pointed toward the ceiling and your feet are flat on the floor.  Tense your lower abdominal muscles to press your low back into the floor. This motion will rotate your pelvis so that your tail bone is scooping  upwards rather than pointing at your feet or into the floor. With a gentle tension and even breathing, hold this position for __________ seconds. Repeat __________ times. Complete this exercise __________ times per day.  STRENGTHENING - Abdominals, Crunches   Lie on a firm bed or floor. Keeping your legs in front of you, bend your knees so they are both pointed toward the ceiling and your feet are flat on the floor. Cross your arms over your chest.  Slightly tip your chin down without bending your neck.  Tense your abdominals and slowly lift your trunk high enough to just clear your shoulder blades. Lifting higher can put excessive stress on the lower back  and does not further strengthen your abdominal muscles.  Control your return to the starting position. Repeat __________ times. Complete this exercise __________ times per day.  STRENGTHENING - Quadruped, Opposite UE/LE Lift   Assume a hands and knees position on a firm surface. Keep your hands under your shoulders and your knees under your hips. You may place padding under your knees for comfort.  Find your neutral spine and gently tense your abdominal muscles so that you can maintain this position. Your shoulders and hips should form a rectangle that is parallel with the floor and is not twisted.  Keeping your trunk steady, lift your right hand no higher than your shoulder and then your left leg no higher than your hip. Make sure you are not holding your breath. Hold this position for __________ seconds.  Continuing to keep your abdominal muscles tense and your back steady, slowly return to your starting position. Repeat with the opposite arm and leg. Repeat __________ times. Complete this exercise __________ times per day.  STRENGTHENING - Abdominals and Quadriceps, Straight Leg Raise   Lie on a firm bed or floor with both legs extended in front of you.  Keeping one leg in contact with the floor, bend the other knee so that your foot  can rest flat on the floor.  Find your neutral spine, and tense your abdominal muscles to maintain your spinal position throughout the exercise.  Slowly lift your straight leg off the floor about 6 inches for a count of 15, making sure to not hold your breath.  Still keeping your neutral spine, slowly lower your leg all the way to the floor. Repeat this exercise with each leg __________ times. Complete this exercise __________ times per day. POSTURE AND BODY MECHANICS CONSIDERATIONS - Low Back Sprain Keeping correct posture when sitting, standing or completing your activities will reduce the stress put on different body tissues, allowing injured tissues a chance to heal and limiting painful experiences. The following are general guidelines for improved posture. Your physician or physical therapist will provide you with any instructions specific to your needs. While reading these guidelines, remember:  The exercises prescribed by your provider will help you have the flexibility and strength to maintain correct postures.  The correct posture provides the best environment for your joints to work. All of your joints have less wear and tear when properly supported by a spine with good posture. This means you will experience a healthier, less painful body.  Correct posture must be practiced with all of your activities, especially prolonged sitting and standing. Correct posture is as important when doing repetitive low-stress activities (typing) as it is when doing a single heavy-load activity (lifting). RESTING POSITIONS Consider which positions are most painful for you when choosing a resting position. If you have pain with flexion-based activities (sitting, bending, stooping, squatting), choose a position that allows you to rest in a less flexed posture. You would want to avoid curling into a fetal position on your side. If your pain worsens with extension-based activities (prolonged standing, working  overhead), avoid resting in an extended position such as sleeping on your stomach. Most people will find more comfort when they rest with their spine in a more neutral position, neither too rounded nor too arched. Lying on a non-sagging bed on your side with a pillow between your knees, or on your back with a pillow under your knees will often provide some relief. Keep in mind, being in any one position for a  prolonged period of time, no matter how correct your posture, can still lead to stiffness. PROPER SITTING POSTURE In order to minimize stress and discomfort on your spine, you must sit with correct posture. Sitting with good posture should be effortless for a healthy body. Returning to good posture is a gradual process. Many people can work toward this most comfortably by using various supports until they have the flexibility and strength to maintain this posture on their own. When sitting with proper posture, your ears will fall over your shoulders and your shoulders will fall over your hips. You should use the back of the chair to support your upper back. Your lower back will be in a neutral position, just slightly arched. You may place a small pillow or folded towel at the base of your lower back for  support.  When working at a desk, create an environment that supports good, upright posture. Without extra support, muscles tire, which leads to excessive strain on joints and other tissues. Keep these recommendations in mind: CHAIR:  A chair should be able to slide under your desk when your back makes contact with the back of the chair. This allows you to work closely.  The chair's height should allow your eyes to be level with the upper part of your monitor and your hands to be slightly lower than your elbows. BODY POSITION  Your feet should make contact with the floor. If this is not possible, use a foot rest.  Keep your ears over your shoulders. This will reduce stress on your neck and low  back. INCORRECT SITTING POSTURES  If you are feeling tired and unable to assume a healthy sitting posture, do not slouch or slump. This puts excessive strain on your back tissues, causing more damage and pain. Healthier options include:  Using more support, like a lumbar pillow.  Switching tasks to something that requires you to be upright or walking.  Talking a brief walk.  Lying down to rest in a neutral-spine position. PROLONGED STANDING WHILE SLIGHTLY LEANING FORWARD  When completing a task that requires you to lean forward while standing in one place for a long time, place either foot up on a stationary 2-4 inch high object to help maintain the best posture. When both feet are on the ground, the lower back tends to lose its slight inward curve. If this curve flattens (or becomes too large), then the back and your other joints will experience too much stress, tire more quickly, and can cause pain. CORRECT STANDING POSTURES Proper standing posture should be assumed with all daily activities, even if they only take a few moments, like when brushing your teeth. As in sitting, your ears should fall over your shoulders and your shoulders should fall over your hips. You should keep a slight tension in your abdominal muscles to brace your spine. Your tailbone should point down to the ground, not behind your body, resulting in an over-extended swayback posture.  INCORRECT STANDING POSTURES  Common incorrect standing postures include a forward head, locked knees and/or an excessive swayback. WALKING Walk with an upright posture. Your ears, shoulders and hips should all line-up. PROLONGED ACTIVITY IN A FLEXED POSITION When completing a task that requires you to bend forward at your waist or lean over a low surface, try to find a way to stabilize 3 out of 4 of your limbs. You can place a hand or elbow on your thigh or rest a knee on the surface you are reaching  across. This will provide you more  stability, so that your muscles do not tire as quickly. By keeping your knees relaxed, or slightly bent, you will also reduce stress across your lower back. CORRECT LIFTING TECHNIQUES DO :  Assume a wide stance. This will provide you more stability and the opportunity to get as close as possible to the object which you are lifting.  Tense your abdominals to brace your spine. Bend at the knees and hips. Keeping your back locked in a neutral-spine position, lift using your leg muscles. Lift with your legs, keeping your back straight.  Test the weight of unknown objects before attempting to lift them.  Try to keep your elbows locked down at your sides in order get the best strength from your shoulders when carrying an object.  Always ask for help when lifting heavy or awkward objects. INCORRECT LIFTING TECHNIQUES DO NOT:   Lock your knees when lifting, even if it is a small object.  Bend and twist. Pivot at your feet or move your feet when needing to change directions.  Assume that you can safely pick up even a paperclip without proper posture.   This information is not intended to replace advice given to you by your health care provider. Make sure you discuss any questions you have with your health care provider.   Document Released: 10/14/2005 Document Revised: 11/04/2014 Document Reviewed: 01/26/2009 Elsevier Interactive Patient Education [employer]

## 2015-12-07 ENCOUNTER — Emergency Department: Payer: Worker's Compensation

## 2015-12-07 ENCOUNTER — Encounter: Payer: Self-pay | Admitting: Emergency Medicine

## 2015-12-07 ENCOUNTER — Emergency Department
Admission: EM | Admit: 2015-12-07 | Discharge: 2015-12-07 | Disposition: A | Discharge status: Home / Self Care | Payer: Worker's Compensation | Attending: Emergency Medicine | Admitting: Emergency Medicine

## 2015-12-07 DIAGNOSIS — M62830 Muscle spasm of back: Secondary | ICD-10-CM | POA: Diagnosis not present

## 2015-12-07 DIAGNOSIS — S8012XD Contusion of left lower leg, subsequent encounter: Secondary | ICD-10-CM | POA: Insufficient documentation

## 2015-12-07 DIAGNOSIS — Z791 Long term (current) use of non-steroidal anti-inflammatories (NSAID): Secondary | ICD-10-CM | POA: Insufficient documentation

## 2015-12-07 DIAGNOSIS — Z880 Allergy status to penicillin: Secondary | ICD-10-CM | POA: Diagnosis not present

## 2015-12-07 DIAGNOSIS — W1839XD Other fall on same level, subsequent encounter: Secondary | ICD-10-CM | POA: Insufficient documentation

## 2015-12-07 DIAGNOSIS — I10 Essential (primary) hypertension: Secondary | ICD-10-CM | POA: Insufficient documentation

## 2015-12-07 DIAGNOSIS — F17210 Nicotine dependence, cigarettes, uncomplicated: Secondary | ICD-10-CM | POA: Diagnosis not present

## 2015-12-07 DIAGNOSIS — M545 Low back pain: Secondary | ICD-10-CM | POA: Diagnosis present

## 2015-12-07 DIAGNOSIS — S300XXD Contusion of lower back and pelvis, subsequent encounter: Secondary | ICD-10-CM | POA: Insufficient documentation

## 2015-12-07 DIAGNOSIS — R51 Headache: Secondary | ICD-10-CM | POA: Insufficient documentation

## 2015-12-07 DIAGNOSIS — S0101XD Laceration without foreign body of scalp, subsequent encounter: Secondary | ICD-10-CM | POA: Diagnosis not present

## 2015-12-07 HISTORY — DX: Personal history of transient ischemic attack (TIA), and cerebral infarction without residual deficits: Z86.73

## 2015-12-07 MED ORDER — CYCLOBENZAPRINE HCL 10 MG PO TABS: 10.0000 mg | ORAL_TABLET | Freq: Three times a day (TID) | ORAL | Status: DC | PRN

## 2015-12-07 MED ORDER — OXYCODONE-ACETAMINOPHEN 5-325 MG PO TABS: 1.0000 | ORAL_TABLET | ORAL | Status: DC | PRN

## 2015-12-07 NOTE — ED Notes (Signed)
Pt to ed with c/o head injury at work 2 days ago.  Pt was seen here for subdural hemmorhage and received 5 staples to the back of his head.  Pt reports over the past 2 days he has had shaking during his sleep and "blackout spells"

## 2015-12-07 NOTE — ED Provider Notes (Signed)
[facility] Emergency Department Provider Note  ____________________________________________  Time seen: Approximately 12:20 PM  I have reviewed the triage vital signs and the nursing notes.   HISTORY  Chief Complaint Follow-up    HPI Isaiah Green. is a 35 y.o. male , NAD, presents to the emergency department for follow-up of a work injury that occurred 2 days ago. Was seen in this emergency department after the injury and had full workup.  X-rays of the lumbar spine, left forearm, left tib-fib noted no acute injuries or bony abnormalities. CT of the head showed a small amount of a subdural hemorrhage that was noted along the left anterior falx cerebri but no other acute findings or skull fractures. Neurosurgery on call at that time was consult and and noted the hemorrhage was nonsignificant CT finding and no further follow-up imaging would be needed. Patient was discharged with pain medications and a muscle relaxer and asked to follow up with community physician in 2 days for recheck. She presents today with continued headache, left lower leg pain, lower back pain. States his pain is severe. Has been taking pain medicines every 4 hours and notes he did not receive a prescription for his muscle relaxants. Also notes that he "shakes" in his sleep, loses track of time, he continues with headache. The symptoms are worse today than they were 2 days ago. Denies any numbness, weakness, tingling. No visual changes. Patient does note that he had seizures as a child that stopped around the age of 65. Also notes that he shakes in his sleep chronically but says it is worse per observer comments. Denies any overt seizure activity at this time. Denies saddle paresthesias or loss of bowel or bladder control.   Past Medical History  Diagnosis Date  . H/o Lyme disease   . Hypertension   . History of TIAs     There are no active problems to display for this patient.   History  reviewed. No pertinent past surgical history.  Current Outpatient Rx  Name  Route  Sig  Dispense  Refill  . Aspirin-Acetaminophen (GOODYS BODY PAIN PO)   Oral   Take 1 Package by mouth as needed.         . cyclobenzaprine (FLEXERIL) 10 MG tablet   Oral   Take 1 tablet (10 mg total) by mouth 3 (three) times daily as needed for muscle spasms.   21 tablet   0   . doxycycline (VIBRA-TABS) 100 MG tablet   Oral   Take 1 tablet (100 mg total) by mouth 2 (two) times daily. Patient not taking: Reported on 11/15/2015   28 tablet   0   . HYDROcodone-acetaminophen (NORCO/VICODIN) 5-325 MG tablet   Oral   Take 1 tablet by mouth every 4 (four) hours as needed for moderate pain.   15 tablet   0   . ibuprofen (ADVIL,MOTRIN) 200 MG tablet   Oral   Take 200 mg by mouth every 6 (six) hours as needed.         . meloxicam (MOBIC) 15 MG tablet   Oral   Take 1 tablet (15 mg total) by mouth daily.   30 tablet   0   . oxyCODONE-acetaminophen (PERCOCET/ROXICET) 5-325 MG tablet   Oral   Take 1 tablet by mouth every 4 (four) hours as needed for severe pain.   12 tablet   0   . predniSONE (DELTASONE) 10 MG tablet   Oral   Take  1 tablet (10 mg total) by mouth daily. 6,5,4,3,2,1 six day taper Patient not taking: Reported on 11/15/2015   21 tablet   0   . sulfamethoxazole-trimethoprim (BACTRIM DS,SEPTRA DS) 800-160 MG per tablet   Oral   Take 1 tablet by mouth 2 (two) times daily. Patient not taking: Reported on 11/15/2015   20 tablet   0     Allergies Aspirin; Penicillins; and Tegretol  No family history on file.  Social History Social History  Substance Use Topics  . Smoking status: Current Every Day Smoker -- 2.00 packs/day    Types: Cigars  . Smokeless tobacco: None  . Alcohol Use: No     Review of Systems  Constitutional: No fever/chills Eyes: No visual changes.  Cardiovascular: No chest pain. Respiratory: No cough. No shortness of breath. No wheezing.   Gastrointestinal: No abdominal pain.  No nausea, vomiting.   Musculoskeletal: Positive for back pain, left lower leg pain.  Skin:  Positive for bruising about left lower leg. Negative for rash. Neurological: Positive for headaches, but no focal weakness or numbness. 10-point ROS otherwise negative.  ____________________________________________   PHYSICAL EXAM:  VITAL SIGNS: ED Triage Vitals  Enc Vitals Group     BP 12/07/15 1153 154/91 mmHg     Pulse Rate 12/07/15 1153 88     Resp 12/07/15 1153 16     Temp 12/07/15 1153 97.7 F (36.5 C)     Temp Source 12/07/15 1153 Oral     SpO2 12/07/15 1153 99 %     Weight 12/07/15 1153 195 lb (88.451 kg)     Height 12/07/15 1153  (1.88 m)     Head Cir --      Peak Flow --      Pain Score 12/07/15 1153 10     Pain Loc --      Pain Edu? --      Excl. in GC? --     Constitutional: Alert and oriented. Well appearing and in no acute distress. Eyes: Conjunctivae are normal. PERRL. EOMI without pain.  Neck: Supple with full range of motion Hematological/Lymphatic/Immunilogical: No cervical lymphadenopathy. Cardiovascular:  Good peripheral circulation. Respiratory: Normal respiratory effort without tachypnea or retractions.  Musculoskeletal: Diffuse lower back pain to palpation centrally as well as laterally. Muscle spasm noted left lateral lumbar spinal tenderness to palpation. Proximal left lower leg with tenderness to palpation over bony prominence. No muscle spasm appreciated of the left calf. No lower extremity tenderness nor edema.  No joint effusions. Neurologic:  Normal speech and language. No gross focal neurologic deficits are appreciated. Sensation intact to bilateral lower extremities. Skin:  Posterior scalp laceration with 5 staples in place. No dehiscence. No oozing or weeping. Eschar is beginning to form. Light blue ecchymosis about the proximal, lateral left lower leg.  Psychiatric: Mood and affect are normal. Speech and  behavior are normal. Patient exhibits appropriate insight and judgement.   ____________________________________________   LABS (all labs ordered are listed, but only abnormal results are displayed)  Labs Reviewed - No data to display ____________________________________________  EKG  None ____________________________________________  RADIOLOGY I have personally viewed and evaluated these images (plain radiographs) as part of my medical decision making, as well as reviewing the written report by the radiologist.  Ct Head Wo Contrast  12/07/2015  CLINICAL DATA:  Larey Seat 2 days ago, continued head pain, occasional loss of consciousness, history hypertension, TIA, smoking EXAM: CT HEAD WITHOUT CONTRAST TECHNIQUE: Contiguous axial images were obtained from the  base of the skull through the vertex without intravenous contrast. COMPARISON:  12/05/2015 FINDINGS: Normal ventricular morphology. No midline shift or mass effect. Normal appearance of brain parenchyma. No intracranial hemorrhage, mass lesion or evidence acute infarction. No extra-axial fluid collections. Small posterior scalp hematoma and skin clips. Skull intact. Visualized paranasal sinuses and mastoid air cells clear. IMPRESSION: No acute intracranial abnormalities. Electronically Signed   By: Ulyses Southward M.D.   On: 12/07/2015 13:27    ____________________________________________    PROCEDURES  Procedure(s) performed: None    Medications - No data to display   ____________________________________________   INITIAL IMPRESSION / ASSESSMENT AND PLAN / ED COURSE  Pertinent imaging results that were available during my care of the patient were reviewed by me and considered in my medical decision making (see chart for details).  Patient's case discussed with Dr. Daryel November who originally assessed him. He agrees with the treatment plan below.   Patient's diagnosis is consistent with lumbago with muscle spasm status  post fall at work. Will discharge with prescriptions for Percocet 5 mg to take one to 2 tablets by mouth every 6 hours as needed for severe pain. This is a step down from original prescription given 12/05/2015. Will also give prescription for cyclobenzaprine 10 mg take one tablet every 8 hours as needed for muscle spasms. Discussed with patient that he will need to follow up with orthopedics for follow-up. Name and phone number to set up an appointment was given at discharge. 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 Will refer him to neurology for follow-up of those chronic issues.  Patient is given ED precautions to return to the ED for any worsening or new symptoms.     ____________________________________________  FINAL CLINICAL IMPRESSION(S) / ED DIAGNOSES  Final diagnoses:  Muscle spasm of back  Contusion of left lower leg, subsequent encounter  Contusion of lower back, subsequent encounter      NEW MEDICATIONS STARTED DURING THIS VISIT:  New Prescriptions   CYCLOBENZAPRINE (FLEXERIL) 10 MG TABLET    Take 1 tablet (10 mg total) by mouth 3 (three) times daily as needed for muscle spasms.   OXYCODONE-ACETAMINOPHEN (PERCOCET/ROXICET) 5-325 MG TABLET    Take 1 tablet by mouth every 4 (four) hours as needed for severe pain.         Hope Pigeon, PA-C 12/07/15 1407  Emily Filbert, MD 12/13/15 [phone]

## 2015-12-07 NOTE — Discharge Instructions (Signed)
Heat Therapy Heat therapy can help ease sore, stiff, injured, and tight muscles and joints. Heat relaxes your muscles, which may help ease your pain. Heat therapy should only be used on old, pre-existing, or long-lasting (chronic) injuries. Do not use heat therapy unless told by your doctor. HOW TO USE HEAT THERAPY There are several different kinds of heat therapy, including:  Moist heat pack.  Warm water bath.  Hot water bottle.  Electric heating pad.  Heated gel pack.  Heated wrap.  Electric heating pad. GENERAL HEAT THERAPY RECOMMENDATIONS   Do not sleep while using heat therapy. Only use heat therapy while you are awake.  Your skin may turn pink while using heat therapy. Do not use heat therapy if your skin turns red.  Do not use heat therapy if you have new pain.  High heat or long exposure to heat can cause burns. Be careful when using heat therapy to avoid burning your skin.  Do not use heat therapy on areas of your skin that are already irritated, such as with a rash or sunburn. GET HELP IF:   You have blisters, redness, swelling (puffiness), or numbness.  You have new pain.  Your pain is worse. MAKE SURE YOU:  Understand these instructions.  Will watch your condition.  Will get help right away if you are not doing well or get worse.   This information is not intended to replace advice given to you by your health care provider. Make sure you discuss any questions you have with your health care provider.   Document Released: 01/06/2012 Document Revised: 11/04/2014 Document Reviewed: 12/07/2013 Elsevier Interactive Patient Education 2016 [place] Injury Prevention Back injuries can be very painful. They can also be difficult to heal. After having one back injury, you are more likely to injure your back again. It is important to learn how to avoid injuring or re-injuring your back. The following tips can help you to prevent a back injury. WHAT  SHOULD I KNOW ABOUT PHYSICAL FITNESS?  Exercise for 30 minutes per day on most days of the week or as directed by your health care provider. Make sure to:  Do aerobic exercises, such as walking, jogging, biking, or swimming.  Do exercises that increase balance and strength, such as tai chi and yoga. These can decrease your risk of falling and injuring your back.  Do stretching exercises to help with flexibility.  Try to develop strong abdominal muscles. Your abdominal muscles provide a lot of the support that is needed by your back.  Maintain a healthy weight. This helps to decrease your risk of a back injury. WHAT SHOULD I KNOW ABOUT MY DIET?  Talk with your health care provider about your overall diet. Take supplements and vitamins only as directed by your health care provider.  Talk with your health care provider about how much calcium and vitamin D you need each day. These nutrients help to prevent weakening of the bones (osteoporosis). Osteoporosis can cause broken (fractured) bones, which lead to back pain.  Include good sources of calcium in your diet, such as dairy products, green leafy vegetables, and products that have had calcium added to them (fortified).  Include good sources of vitamin D in your diet, such as milk and foods that are fortified with vitamin D. WHAT SHOULD I KNOW ABOUT MY POSTURE?  Sit up straight and stand up straight. Avoid leaning forward when you sit or hunching over when you stand.  Choose chairs that have  good low-back (lumbar) support.  If you work at a desk, sit close to it so you do not need to lean over. Keep your chin tucked in. Keep your neck drawn back, and keep your elbows bent at a right angle. Your arms should look like the letter "L."  Sit high and close to the steering wheel when you drive. Add a lumbar support to your car seat, if needed.  Avoid sitting or standing in one position for very long. Take breaks to get up, stretch, and walk  around at least one time every hour. Take breaks every hour if you are driving for long periods of time.  Sleep on your side with your knees slightly bent, or sleep on your back with a pillow under your knees. Do not lie on the front of your body to sleep. WHAT SHOULD I KNOW ABOUT LIFTING, TWISTING, AND REACHING? Lifting and Heavy Lifting  Avoid heavy lifting, especially repetitive heavy lifting. If you must do heavy lifting:  Stretch before lifting.  Work slowly.  Rest between lifts.  Use a tool such as a cart or a dolly to move objects if one is available.  Make several small trips instead of carrying one heavy load.  Ask for help when you need it, especially when moving big objects.  Follow these steps when lifting:  Stand with your feet shoulder-width apart.  Get as close to the object as you can. Do not try to pick up a heavy object that is far from your body.  Use handles or lifting straps if they are available.  Bend at your knees. Squat down, but keep your heels off the floor.  Keep your shoulders pulled back, your chin tucked in, and your back straight.  Lift the object slowly while you tighten the muscles in your legs, abdomen, and buttocks. Keep the object as close to the center of your body as possible.  Follow these steps when putting down a heavy load:  Stand with your feet shoulder-width apart.  Lower the object slowly while you tighten the muscles in your legs, abdomen, and buttocks. Keep the object as close to the center of your body as possible.  Keep your shoulders pulled back, your chin tucked in, and your back straight.  Bend at your knees. Squat down, but keep your heels off the floor.  Use handles or lifting straps if they are available. Twisting and Reaching  Avoid lifting heavy objects above your waist.  Do not twist at your waist while you are lifting or carrying a load. If you need to turn, move your feet.  Do not bend over without bending  at your knees.  Avoid reaching over your head, across a table, or for an object on a high surface. WHAT ARE SOME OTHER TIPS?  Avoid wet floors and icy ground. Keep sidewalks clear of ice to prevent falls.  Do not sleep on a mattress that is too soft or too hard.  Keep items that are used frequently within easy reach.  Put heavier objects on shelves at waist level, and put lighter objects on lower or higher shelves.  Find ways to decrease your stress, such as exercise, massage, or relaxation techniques. Stress can build up in your muscles. Tense muscles are more vulnerable to injury.  Talk with your health care provider if you feel anxious or depressed. These conditions can make back pain worse.  Wear flat heel shoes with cushioned soles.  Avoid sudden movements.  Use both  shoulder straps when carrying a backpack.  Do not use any tobacco products, including cigarettes, chewing tobacco, or electronic cigarettes. If you need help quitting, ask your health care provider.   This information is not intended to replace advice given to you by your health care provider. Make sure you discuss any questions you have with your health care provider.   Document Released: 11/21/2004 Document Revised: 02/28/2015 Document Reviewed: 10/18/2014 Elsevier Interactive Patient Education 2016 [employer]  Muscle Cramps and Spasms Muscle cramps and spasms are when muscles tighten by themselves. They usually get better within minutes. Muscle cramps are painful. They are usually stronger and last longer than muscle spasms. Muscle spasms may or may not be painful. They can last a few seconds or much longer. HOME CARE  Drink enough fluid to keep your pee (urine) clear or pale yellow.  Massage, stretch, and relax the muscle.  Use a warm towel, heating pad, or warm shower water on tight muscles.  Place ice on the muscle if it is tender or in pain.  Put ice in a plastic bag.  Place a towel between  your skin and the bag.  Leave the ice on for 15-20 minutes, 03-04 times a day.  Only take medicine as told by your doctor. GET HELP RIGHT AWAY IF:  Your cramps or spasms get worse, happen more often, or do not get better with time. MAKE SURE YOU:  Understand these instructions.  Will watch your condition.  Will get help right away if you are not doing well or get worse.   This information is not intended to replace advice given to you by your health care provider. Make sure you discuss any questions you have with your health care provider.   Document Released: 09/26/2008 Document Revised: 02/08/2013 Document Reviewed: 09/30/2012 Elsevier Interactive Patient Education 2016 [place]    A contusion is a deep bruise. Contusions are the result of a blunt injury to tissues and muscle fibers under the skin. The injury causes bleeding under the skin. The skin overlying the contusion may turn blue, purple, or yellow. Minor injuries will give you a painless contusion, but more severe contusions may stay painful and swollen for a few weeks.  CAUSES  This condition is usually caused by a blow, trauma, or direct force to an area of the body.  SYMPTOMS  Symptoms of this condition include:  Swelling of the injured area.  Pain and tenderness in the injured area.  Discoloration. The area may have redness and then turn blue, purple, or yellow. DIAGNOSIS  This condition is diagnosed based on a physical exam and medical history. An X-ray, CT scan, or MRI may be needed to determine if there are any associated injuries, such as broken bones (fractures).  TREATMENT  Specific treatment for this condition depends on what area of the body was injured. In general, the best treatment for a contusion is resting, icing, applying pressure to (compression), and elevating the injured area. This is often called the RICE strategy. Over-the-counter anti-inflammatory medicines may also be recommended  for pain control.  HOME CARE INSTRUCTIONS  Rest the injured area.  If directed, apply ice to the injured area:  Put ice in a plastic bag.  Place a towel between your skin and the bag.  Leave the ice on for 20 minutes, 2-3 times per day. If directed, apply light compression to the injured area using an elastic bandage. Make sure the bandage is not wrapped too  tightly. Remove and reapply the bandage as directed by your health care provider.  If possible, raise (elevate) the injured area above the level of your heart while you are sitting or lying down.  Take over-the-counter and prescription medicines only as told by your health care provider. SEEK MEDICAL CARE IF:  Your symptoms do not improve after several days of treatment.  Your symptoms get worse.  You have difficulty moving the injured area. SEEK IMMEDIATE MEDICAL CARE IF:  You have severe pain.  You have numbness in a hand or foot.  Your hand or foot turns pale or cold. This information is not intended to replace advice given to you by your health care provider. Make sure you discuss any questions you have with your health care provider.  Document Released: 07/24/2005 Document Revised: 07/05/2015 Document Reviewed: 03/01/2015  Elsevier Interactive Patient Education [employer].

## 2015-12-07 NOTE — ED Notes (Signed)
Pt here after being seen 2 days ago for fall; pt reports continued pain in head, left hip, leg. Pt reports ocassional LOC reported from "friends".

## 2016-01-17 ENCOUNTER — Encounter: Payer: Self-pay | Admitting: Emergency Medicine

## 2016-01-17 ENCOUNTER — Emergency Department
Admission: EM | Admit: 2016-01-17 | Discharge: 2016-01-17 | Disposition: A | Discharge status: Home / Self Care | Payer: Worker's Compensation | Attending: Emergency Medicine | Admitting: Emergency Medicine

## 2016-01-17 DIAGNOSIS — G44309 Post-traumatic headache, unspecified, not intractable: Secondary | ICD-10-CM | POA: Insufficient documentation

## 2016-01-17 DIAGNOSIS — Z79899 Other long term (current) drug therapy: Secondary | ICD-10-CM | POA: Insufficient documentation

## 2016-01-17 DIAGNOSIS — I10 Essential (primary) hypertension: Secondary | ICD-10-CM | POA: Insufficient documentation

## 2016-01-17 DIAGNOSIS — M542 Cervicalgia: Secondary | ICD-10-CM | POA: Insufficient documentation

## 2016-01-17 DIAGNOSIS — G8929 Other chronic pain: Secondary | ICD-10-CM | POA: Insufficient documentation

## 2016-01-17 DIAGNOSIS — F17210 Nicotine dependence, cigarettes, uncomplicated: Secondary | ICD-10-CM | POA: Insufficient documentation

## 2016-01-17 DIAGNOSIS — Z791 Long term (current) use of non-steroidal anti-inflammatories (NSAID): Secondary | ICD-10-CM | POA: Insufficient documentation

## 2016-01-17 DIAGNOSIS — S069X0S Unspecified intracranial injury without loss of consciousness, sequela: Secondary | ICD-10-CM

## 2016-01-17 DIAGNOSIS — Z880 Allergy status to penicillin: Secondary | ICD-10-CM | POA: Insufficient documentation

## 2016-01-17 MED ORDER — HYDROMORPHONE HCL 1 MG/ML IJ SOLN
1.0000 mg | Freq: Once | INTRAMUSCULAR | Status: AC
  Administered 2016-01-17: 1 mg via INTRAMUSCULAR
  Filled 2016-01-17: qty 1

## 2016-01-17 NOTE — ED Notes (Signed)
Pt's ride at bedside. Will discharge in ten-fifteen minutes.

## 2016-01-17 NOTE — Discharge Instructions (Signed)
Advised follow-up with the pain clinic for continued care.

## 2016-01-17 NOTE — ED Notes (Signed)
This writer walked pt out to vehicle at time of discharge in which his friend was driving. Pt did not drive at time of discharge.

## 2016-01-17 NOTE — ED Notes (Signed)
Pt was injured on the job on 12/05/15 after a fall. Here with c/o ongoing head and neck pain. States he has bulging discs in his back.

## 2016-01-17 NOTE — ED Provider Notes (Signed)
[facility] Emergency Department Provider Note  ____________________________________________  Time seen: Approximately 1:50 PM  I have reviewed the triage vital signs and the nursing notes.   HISTORY  Chief Complaint Headache and Neck Pain    HPI Isaiah Green. is a 35 y.o. male patient complain of ongoing head and neck pain secondary to a fall at work on 12/05/2015. Patient state he was scheduled to have MRI today but found out it was denied by his worker's [employer] Patient is requesting MRI. The emergency room and pain medications. Review of the patient's past visits showed he had a CT scan of the head showed showing a small subdural hematoma on 2 04/28/2016. Patient had a follow-up CT ScanI on 12/07/2015 which showed subdural hematoma resolved. Patient say his neck pain is secondary to bulging disks. Patient denies any radicular component to his neck pain. He denies any loss of function to the upper extremities. Patient is rating his pain as a 7/10. No palliative measures taken for this complaint.   Past Medical History  Diagnosis Date  . H/o Lyme disease   . Hypertension   . History of TIAs     There are no active problems to display for this patient.   History reviewed. No pertinent past surgical history.  Current Outpatient Rx  Name  Route  Sig  Dispense  Refill  . Aspirin-Acetaminophen (GOODYS BODY PAIN PO)   Oral   Take 1 Package by mouth as needed.         . cyclobenzaprine (FLEXERIL) 10 MG tablet   Oral   Take 1 tablet (10 mg total) by mouth 3 (three) times daily as needed for muscle spasms.   21 tablet   0   . doxycycline (VIBRA-TABS) 100 MG tablet   Oral   Take 1 tablet (100 mg total) by mouth 2 (two) times daily. Patient not taking: Reported on 11/15/2015   28 tablet   0   . HYDROcodone-acetaminophen (NORCO/VICODIN) 5-325 MG tablet   Oral   Take 1 tablet by mouth every 4 (four) hours as needed for moderate pain.   15  tablet   0   . ibuprofen (ADVIL,MOTRIN) 200 MG tablet   Oral   Take 200 mg by mouth every 6 (six) hours as needed.         . meloxicam (MOBIC) 15 MG tablet   Oral   Take 1 tablet (15 mg total) by mouth daily.   30 tablet   0   . oxyCODONE-acetaminophen (PERCOCET/ROXICET) 5-325 MG tablet   Oral   Take 1 tablet by mouth every 4 (four) hours as needed for severe pain.   12 tablet   0   . predniSONE (DELTASONE) 10 MG tablet   Oral   Take 1 tablet (10 mg total) by mouth daily. 6,5,4,3,2,1 six day taper Patient not taking: Reported on 11/15/2015   21 tablet   0   . sulfamethoxazole-trimethoprim (BACTRIM DS,SEPTRA DS) 800-160 MG per tablet   Oral   Take 1 tablet by mouth 2 (two) times daily. Patient not taking: Reported on 11/15/2015   20 tablet   0     Allergies Aspirin; Penicillins; and Tegretol  No family history on file.  Social History Social History  Substance Use Topics  . Smoking status: Current Every Day Smoker -- 1.00 packs/day    Types: Cigars  . Smokeless tobacco: None  . Alcohol Use: No    Review of Systems Constitutional:  No fever/chills Eyes: No visual changes. ENT: No sore throat. Cardiovascular: Denies chest pain. Respiratory: Denies shortness of breath. Gastrointestinal: No abdominal pain.  No nausea, no vomiting.  No diarrhea.  No constipation. Genitourinary: Negative for dysuria. Musculoskeletal: Negative for back pain. Skin: Negative for rash. Neurological: Positive for headaches, but denies focal weakness or numbness. Allergic/Immunilogical: Aspirin and penicillin  10-point ROS otherwise negative.  ____________________________________________   PHYSICAL EXAM:  VITAL SIGNS: ED Triage Vitals  Enc Vitals Group     BP 01/17/16 1234 178/102 mmHg     Pulse Rate 01/17/16 1234 108     Resp --      Temp 01/17/16 1234 97.4 F (36.3 C)     Temp Source 01/17/16 1234 Oral     SpO2 01/17/16 1234 100 %     Weight 01/17/16 1232 195 lb  (88.451 kg)     Height 01/17/16 1232 6\' 2"  (1.88 m)     Head Cir --      Peak Flow --      Pain Score 01/17/16 1232 7     Pain Loc --      Pain Edu? --      Excl. in GC? --     Constitutional: Alert and oriented. Well appearing and in no acute distress. Eyes: Conjunctivae are normal. PERRL. EOMI. Head: Atraumatic. Nose: No congestion/rhinnorhea. Mouth/Throat: Mucous membranes are moist.  Oropharynx non-erythematous. Neck: No stridor.  No cervical spine tenderness to palpation. Hematological/Lymphatic/Immunilogical: No cervical lymphadenopathy. Cardiovascular: Normal rate, regular rhythm. Grossly normal heart sounds.  Good peripheral circulation. Respiratory: Normal respiratory effort.  No retractions. Lungs CTAB. Gastrointestinal: Soft and nontender. No distention. No abdominal bruits. No CVA tenderness. Musculoskeletal: No lower extremity tenderness nor edema.  No joint effusions. Neurologic:  Normal speech and language. No gross focal neurologic deficits are appreciated. No gait instability. Skin:  Skin is warm, dry and intact. No rash noted. Psychiatric: Mood and affect are normal. Speech and behavior are normal.  ____________________________________________   LABS (all labs ordered are listed, but only abnormal results are displayed)  Labs Reviewed - No data to display ____________________________________________  EKG   ____________________________________________  RADIOLOGY  Reviewed previous CT scans of the patient's head. There is no record MRI revealing bulging disks on file. ____________________________________________   PROCEDURES  Procedure(s) performed: None  Critical Care performed: No  ____________________________________________   INITIAL IMPRESSION / ASSESSMENT AND PLAN / ED COURSE  Pertinent labs & imaging results that were available during my care of the patient were reviewed by me and considered in my medical decision making (see chart for  details).  Headaches and chronic neck pain. Advised patient to follow-up with treating doctor due to his Worker's Compensation for continued care. Advised patient  will refer to a pain clinic if desired. 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

## 2016-06-24 ENCOUNTER — Emergency Department
Admission: EM | Admit: 2016-06-24 | Discharge: 2016-06-24 | Disposition: A | Discharge status: Home / Self Care | Payer: Self-pay | Attending: Emergency Medicine | Admitting: Emergency Medicine

## 2016-06-24 ENCOUNTER — Emergency Department: Payer: Self-pay

## 2016-06-24 ENCOUNTER — Encounter: Payer: Self-pay | Admitting: *Deleted

## 2016-06-24 DIAGNOSIS — I10 Essential (primary) hypertension: Secondary | ICD-10-CM | POA: Insufficient documentation

## 2016-06-24 DIAGNOSIS — Z79899 Other long term (current) drug therapy: Secondary | ICD-10-CM | POA: Insufficient documentation

## 2016-06-24 DIAGNOSIS — M25511 Pain in right shoulder: Secondary | ICD-10-CM | POA: Insufficient documentation

## 2016-06-24 DIAGNOSIS — F17210 Nicotine dependence, cigarettes, uncomplicated: Secondary | ICD-10-CM | POA: Insufficient documentation

## 2016-06-24 MED ORDER — TIZANIDINE HCL 4 MG PO TABS: 4.0000 mg | ORAL_TABLET | Freq: Three times a day (TID) | ORAL | 0 refills | Status: DC

## 2016-06-24 MED ORDER — NAPROXEN 500 MG PO TABS: 500.0000 mg | ORAL_TABLET | Freq: Two times a day (BID) | ORAL | 0 refills | Status: DC

## 2016-06-24 NOTE — ED Provider Notes (Signed)
[facility] Emergency Department Provider Note ____________________________________________  Time seen: Approximately 7:45 PM  I have reviewed the triage vital signs and the nursing notes.   HISTORY  Chief Complaint Shoulder Problem    HPI Isaiah RutterGary W Canales Jr. is a 35 y.o. male who presents to the emergency department for evaluation of right shoulder pain. He states that while working on his vehicle, he was lying on the concrete and his shoulder "just didn't feel right." He states that he pulled the transmission out of his car, but does not recall a specific point of injury. He states that in February2017, he fell and injured his right shoulder "I tore up my rotator cuff." He has taken ibuprofen, BC powder and Tylenol without relief. He reports having had CT scans and MRIs of his shoulder, but denies surgical history.  Past Medical History:  Diagnosis Date  . H/o Lyme disease   . History of TIAs   . Hypertension     There are no active problems to display for this patient.   No past surgical history on file.  Prior to Admission medications   Medication Sig Start Date End Date Taking? Authorizing Provider  naproxen (NAPROSYN) 500 MG tablet Take 1 tablet (500 mg total) by mouth 2 (two) times daily with a meal. 06/24/16   Lakiah Dhingra B Elanna Bert, FNP  tiZANidine (ZANAFLEX) 4 MG tablet Take 1 tablet (4 mg total) by mouth 3 (three) times daily. 06/24/16   Chinita Pesterari B Davanna He, FNP    Allergies Aspirin; Penicillins; and Tegretol [carbamazepine]  No family history on file.  Social History Social History  Substance Use Topics  . Smoking status: Current Every Day Smoker    Packs/day: 1.00    Types: Cigars  . Smokeless tobacco: Never Used  . Alcohol use No    Review of Systems Constitutional: No recent illness. Cardiovascular: Denies chest pain or palpitations. Respiratory: Denies shortness of breath. Musculoskeletal: Pain in Right shoulder. Skin: Negative for rash,  wound, lesion. Neurological: Negative for focal weakness or numbness.  ____________________________________________   PHYSICAL EXAM:  VITAL SIGNS: ED Triage Vitals  Enc Vitals Group     BP 06/24/16 1925 (!) 163/96     Pulse Rate 06/24/16 1925 92     Resp 06/24/16 1925 20     Temp 06/24/16 1925 97.9 F (36.6 C)     Temp Source 06/24/16 1925 Oral     SpO2 06/24/16 1925 96 %     Weight 06/24/16 1927 220 lb (99.8 kg)     Height 06/24/16 1927 6\' 2"  (1.88 m)     Head Circumference --      Peak Flow --      Pain Score 06/24/16 1927 9     Pain Loc --      Pain Edu? --      Excl. in GC? --     Constitutional: Alert and oriented. Well appearing and in no acute distress. Eyes: Conjunctivae are normal. EOMI. Head: Atraumatic. Neck: No stridor.  Respiratory: Normal respiratory effort.   Musculoskeletal: Limited range of motion of the right shoulder secondary to pain in the scapular area. No winging of the scapula.  Neurologic:  Normal speech and language. No gross focal neurologic deficits are appreciated. Speech is normal. No gait instability. Grip strength is equal. Skin:  Skin is warm, dry and intact. Atraumatic. Psychiatric: Mood and affect are normal. Speech and behavior are normal.  ____________________________________________   LABS (all labs ordered are listed, but  only abnormal results are displayed)  Labs Reviewed - No data to display ____________________________________________  RADIOLOGY  No acute bony abnormality of the right scapula per radiology. ____________________________________________   PROCEDURES  Procedure(s) performed: None   ____________________________________________   INITIAL IMPRESSION / ASSESSMENT AND PLAN / ED COURSE  Clinical Course    Pertinent labs & imaging results that were available during my care of the patient were reviewed by me and considered in my medical decision making (see chart for details).  Patient was given  prescription for Naprosyn and Flexeril. He was strongly encouraged follow-up with orthopedics for symptoms that are not improving over the week. He was encouraged to return to the emergency department for symptoms that change or worsen if he is unable schedule an appointment. ____________________________________________   FINAL CLINICAL IMPRESSION(S) / ED DIAGNOSES  Final diagnoses:  Shoulder pain, acute, right       Chinita Pester, FNP 06/24/16 2131    Minna Antis, MD 06/24/16 2235

## 2016-06-24 NOTE — ED Notes (Signed)
Pt presents to ED with pain in right shoulder blade. Noted deformity. Appears old no redness abrasion or swelling noted.  Pt states he fell in feb 2017. Pt has recurrent pain to shoulder and neck since fall.  No recent injury pt states the other day on concrete taking out transmission pain got worse since then.  Pt states pain with some ROM cant raise arm above head.

## 2016-06-24 NOTE — ED Triage Notes (Signed)
Pt reports pain in right shoulder blade.  Pt states he fell in feb 2017, pain is recurrent since fall.  No recent injury.  States pain with rom.

## 2016-06-24 NOTE — ED Notes (Signed)
Discharge instructions reviewed with patient. Questions fielded by this RN. Patient verbalizes understanding of instructions. Patient discharged home in stable condition per [place] NP. No acute distress noted at time of discharge.   

## 2016-11-08 ENCOUNTER — Emergency Department: Payer: Self-pay

## 2016-11-08 ENCOUNTER — Emergency Department
Admission: EM | Admit: 2016-11-08 | Discharge: 2016-11-08 | Disposition: A | Discharge status: Home / Self Care | Payer: Self-pay | Attending: Emergency Medicine | Admitting: Emergency Medicine

## 2016-11-08 DIAGNOSIS — Z791 Long term (current) use of non-steroidal anti-inflammatories (NSAID): Secondary | ICD-10-CM | POA: Insufficient documentation

## 2016-11-08 DIAGNOSIS — F17210 Nicotine dependence, cigarettes, uncomplicated: Secondary | ICD-10-CM | POA: Insufficient documentation

## 2016-11-08 DIAGNOSIS — I10 Essential (primary) hypertension: Secondary | ICD-10-CM | POA: Insufficient documentation

## 2016-11-08 DIAGNOSIS — B349 Viral infection, unspecified: Secondary | ICD-10-CM | POA: Insufficient documentation

## 2016-11-08 LAB — URINALYSIS, COMPLETE (UACMP) WITH MICROSCOPIC
BACTERIA UA: NONE SEEN
BILIRUBIN URINE: NEGATIVE
GLUCOSE, UA: 50 mg/dL — AB
Ketones, ur: NEGATIVE mg/dL
NITRITE: NEGATIVE
PH: 5 (ref 5.0–8.0)
Protein, ur: 30 mg/dL — AB
SPECIFIC GRAVITY, URINE: 1.035 — AB (ref 1.005–1.030)

## 2016-11-08 LAB — CBC WITH DIFFERENTIAL/PLATELET
Basophils Absolute: 0 10*3/uL (ref 0–0.1)
Basophils Relative: 0 %
Eosinophils Absolute: 0.1 10*3/uL (ref 0–0.7)
Eosinophils Relative: 1 %
HEMATOCRIT: 48.1 % (ref 40.0–52.0)
HEMOGLOBIN: 16.6 g/dL (ref 13.0–18.0)
LYMPHS ABS: 1.1 10*3/uL (ref 1.0–3.6)
LYMPHS PCT: 15 %
MCH: 29.8 pg (ref 26.0–34.0)
MCHC: 34.6 g/dL (ref 32.0–36.0)
MCV: 86.2 fL (ref 80.0–100.0)
MONOS PCT: 8 %
Monocytes Absolute: 0.6 10*3/uL (ref 0.2–1.0)
NEUTROS ABS: 5.8 10*3/uL (ref 1.4–6.5)
NEUTROS PCT: 76 %
Platelets: 257 10*3/uL (ref 150–440)
RBC: 5.59 MIL/uL (ref 4.40–5.90)
RDW: 14 % (ref 11.5–14.5)
WBC: 7.6 10*3/uL (ref 3.8–10.6)

## 2016-11-08 LAB — COMPREHENSIVE METABOLIC PANEL
ALK PHOS: 62 U/L (ref 38–126)
ALT: 22 U/L (ref 17–63)
ANION GAP: 8 (ref 5–15)
AST: 32 U/L (ref 15–41)
Albumin: 4.7 g/dL (ref 3.5–5.0)
BUN: 16 mg/dL (ref 6–20)
CALCIUM: 9.4 mg/dL (ref 8.9–10.3)
CO2: 23 mmol/L (ref 22–32)
Chloride: 108 mmol/L (ref 101–111)
Creatinine, Ser: 0.78 mg/dL (ref 0.61–1.24)
Glucose, Bld: 97 mg/dL (ref 65–99)
Potassium: 3.9 mmol/L (ref 3.5–5.1)
SODIUM: 139 mmol/L (ref 135–145)
TOTAL PROTEIN: 8.5 g/dL — AB (ref 6.5–8.1)
Total Bilirubin: 0.8 mg/dL (ref 0.3–1.2)

## 2016-11-08 LAB — TROPONIN I: Troponin I: <0.03 ng/mL (ref <0.03)

## 2016-11-08 LAB — INFLUENZA PANEL BY PCR (TYPE A & B)
Influenza A By PCR: NEGATIVE
Influenza B By PCR: NEGATIVE

## 2016-11-08 MED ORDER — OSELTAMIVIR PHOSPHATE 75 MG PO CAPS: 75.0000 mg | ORAL_CAPSULE | Freq: Two times a day (BID) | ORAL | 0 refills | Status: AC

## 2016-11-08 NOTE — ED Triage Notes (Addendum)
Pt came to Ed via pov c/o sob, body aches, weakness, cough for 1 week. Reports feeling sweaty. Pt reports uses heroine to make him feel better but has not masked the symptoms. Last used 0.5 bag this morning. Denies hi/si. Reports is looking into treatment programs with sister/

## 2016-11-08 NOTE — ED Provider Notes (Signed)
[facility] Emergency Department Provider Note   ____________________________________________    I have reviewed the triage vital signs and the nursing notes.   HISTORY  Chief Complaint Body aches and fatigue, cough    HPI Isaiah RutterGary W Payano Jr. is a 36 y.o. male who presents with complaints of body aches, mild cough, fatigue. He reports this has been going on for several days. 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 He denies shortness of breath to me, no chest pain. No rash. No neck pain or headache.   Past Medical History:  Diagnosis Date  . H/o Lyme disease   . History of TIAs   . Hypertension     There are no active problems to display for this patient.   History reviewed. No pertinent surgical history.  Prior to Admission medications   Medication Sig Start Date End Date Taking? Authorizing Provider  naproxen (NAPROSYN) 500 MG tablet Take 1 tablet (500 mg total) by mouth 2 (two) times daily with a meal. 06/24/16   Cari B Triplett, FNP  oseltamivir (TAMIFLU) 75 MG capsule Take 1 capsule (75 mg total) by mouth 2 (two) times daily. 11/08/16 11/13/16  Jene Everyobert Infant Doane, MD  tiZANidine (ZANAFLEX) 4 MG tablet Take 1 tablet (4 mg total) by mouth 3 (three) times daily. 06/24/16   Chinita Pesterari B Triplett, FNP     Allergies Aspirin; Penicillins; and Tegretol [carbamazepine]  No family history on file.  Social History Social History  Substance Use Topics  . Smoking status: Current Every Day Smoker    Packs/day: 1.00    Types: Cigars  . Smokeless tobacco: Never Used  . Alcohol use No    Review of Systems  Constitutional: Intermittent chills Eyes: No visual changes.  ENT: Mild sore throat Cardiovascular: Denies chest pain. Respiratory: Denies shortness of breath.Mild cough, nonproductive Gastrointestinal: No abdominal pain.  No nausea, no vomiting.   Genitourinary: Negative for dysuria. Musculoskeletal: Negative for back pain. No joint swelling Skin:  Negative for rash. Neurological: Negative for headaches  10-point ROS otherwise negative.  ____________________________________________   PHYSICAL EXAM:  VITAL SIGNS: ED Triage Vitals  Enc Vitals Group     BP 11/08/16 1119 (!) 168/136     Pulse Rate 11/08/16 1119 (!) 107     Resp 11/08/16 1119 18     Temp 11/08/16 1119 97.9 F (36.6 C)     Temp Source 11/08/16 1119 Oral     SpO2 11/08/16 1119 98 %     Weight 11/08/16 1114 190 lb (86.2 kg)     Height 11/08/16 1114 6\' 2"  (1.88 m)     Head Circumference --      Peak Flow --      Pain Score --      Pain Loc --      Pain Edu? --      Excl. in GC? --     Constitutional: Alert and oriented. No acute distress. Pleasant and interactive Eyes: Conjunctivae are normal.   Nose: No congestion/rhinnorhea. Mouth/Throat: Mucous membranes are moist.    Cardiovascular: Normal rate, regular rhythm. Grossly normal heart sounds.  Good peripheral circulation. Respiratory: Normal respiratory effort.  No retractions. Lungs CTAB. Gastrointestinal: Soft and nontender. No distention.  No CVA tenderness. Genitourinary: deferred Musculoskeletal: No lower extremity tenderness nor edema.  Warm and well perfused. No splinter hemorrhages Neurologic:  Normal speech and language. No gross focal neurologic deficits are appreciated.  Skin:  Skin is warm, dry and intact. No  rash noted. Psychiatric: Mood and affect are normal. Speech and behavior are normal.  ____________________________________________   LABS (all labs ordered are listed, but only abnormal results are displayed)  Labs Reviewed  COMPREHENSIVE METABOLIC PANEL - Abnormal; Notable for the following:       Result Value   Total Protein 8.5 (*)    All other components within normal limits  URINALYSIS, COMPLETE (UACMP) WITH MICROSCOPIC - Abnormal; Notable for the following:    Color, Urine AMBER (*)    APPearance CLEAR (*)    Specific Gravity, Urine 1.035 (*)    Glucose, UA 50 (*)     Hgb urine dipstick SMALL (*)    Protein, ur 30 (*)    Leukocytes, UA SMALL (*)    Squamous Epithelial / LPF 0-5 (*)    All other components within normal limits  CULTURE, BLOOD (ROUTINE X 2)  TROPONIN I  INFLUENZA PANEL BY PCR (TYPE A & B, H1N1)  CBC WITH DIFFERENTIAL/PLATELET   ____________________________________________  EKG  ED ECG REPORT I, Jene Every, the attending physician, personally viewed and interpreted this ECG.  Date: 11/08/2016   Rhythm: normal sinus rhythm QRS Axis: normal Intervals: normal ST/T Wave abnormalities: normal Conduction Disturbances: none Narrative Interpretation: unremarkable  ____________________________________________  RADIOLOGY  X-ray benign ____________________________________________   PROCEDURES  Procedure(s) performed: No    Critical Care performed: No ____________________________________________   INITIAL IMPRESSION / ASSESSMENT AND PLAN / ED COURSE  Pertinent labs & imaging results that were available during my care of the patient were reviewed by me and considered in my medical decision making (see chart for details).  Patient well-appearing and in no acute distress. Symptoms are most consistent with viral illness which is extremely prevalent in the community at this time however he does have a history of long-term heroin abuse. Lab work is reassuring, chest x-ray is benign. Given this we will treat as an outpatient with strict return precautions work note were Provided.  Clinical Course    ____________________________________________   FINAL CLINICAL IMPRESSION(S) / ED DIAGNOSES  Final diagnoses:  Viral illness      NEW MEDICATIONS STARTED DURING THIS VISIT:  Discharge Medication List as of 11/08/2016  2:14 PM    START taking these medications   Details  oseltamivir (TAMIFLU) 75 MG capsule Take 1 capsule (75 mg total) by mouth 2 (two) times daily., Starting Fri 11/08/2016, Until Wed 11/13/2016, Print           Note:  This document was prepared using Dragon voice recognition software and may include unintentional dictation errors.    Jene Every, MD 11/08/16 1504

## 2016-11-13 LAB — CULTURE, BLOOD (ROUTINE X 2): CULTURE: NO GROWTH

## 2017-03-17 ENCOUNTER — Emergency Department
Admission: EM | Admit: 2017-03-17 | Discharge: 2017-03-18 | Disposition: A | Discharge status: Home / Self Care | Payer: Self-pay | Attending: Emergency Medicine | Admitting: Emergency Medicine

## 2017-03-17 DIAGNOSIS — S40861A Insect bite (nonvenomous) of right upper arm, initial encounter: Secondary | ICD-10-CM | POA: Insufficient documentation

## 2017-03-17 DIAGNOSIS — F1510 Other stimulant abuse, uncomplicated: Secondary | ICD-10-CM

## 2017-03-17 DIAGNOSIS — Y929 Unspecified place or not applicable: Secondary | ICD-10-CM | POA: Insufficient documentation

## 2017-03-17 DIAGNOSIS — F1994 Other psychoactive substance use, unspecified with psychoactive substance-induced mood disorder: Secondary | ICD-10-CM

## 2017-03-17 DIAGNOSIS — Z7289 Other problems related to lifestyle: Secondary | ICD-10-CM

## 2017-03-17 DIAGNOSIS — I10 Essential (primary) hypertension: Secondary | ICD-10-CM | POA: Insufficient documentation

## 2017-03-17 DIAGNOSIS — F1110 Opioid abuse, uncomplicated: Secondary | ICD-10-CM

## 2017-03-17 DIAGNOSIS — X789XXA Intentional self-harm by unspecified sharp object, initial encounter: Secondary | ICD-10-CM

## 2017-03-17 DIAGNOSIS — S61511A Laceration without foreign body of right wrist, initial encounter: Secondary | ICD-10-CM | POA: Insufficient documentation

## 2017-03-17 DIAGNOSIS — S30861A Insect bite (nonvenomous) of abdominal wall, initial encounter: Secondary | ICD-10-CM | POA: Insufficient documentation

## 2017-03-17 DIAGNOSIS — S61519A Laceration without foreign body of unspecified wrist, initial encounter: Secondary | ICD-10-CM

## 2017-03-17 DIAGNOSIS — F17290 Nicotine dependence, other tobacco product, uncomplicated: Secondary | ICD-10-CM | POA: Insufficient documentation

## 2017-03-17 DIAGNOSIS — W57XXXA Bitten or stung by nonvenomous insect and other nonvenomous arthropods, initial encounter: Secondary | ICD-10-CM

## 2017-03-17 DIAGNOSIS — Z5181 Encounter for therapeutic drug level monitoring: Secondary | ICD-10-CM | POA: Insufficient documentation

## 2017-03-17 DIAGNOSIS — Y939 Activity, unspecified: Secondary | ICD-10-CM | POA: Insufficient documentation

## 2017-03-17 DIAGNOSIS — Y999 Unspecified external cause status: Secondary | ICD-10-CM | POA: Insufficient documentation

## 2017-03-17 DIAGNOSIS — X780XXA Intentional self-harm by sharp glass, initial encounter: Secondary | ICD-10-CM | POA: Insufficient documentation

## 2017-03-17 LAB — COMPREHENSIVE METABOLIC PANEL
ALT: 20 U/L (ref 17–63)
AST: 20 U/L (ref 15–41)
Albumin: 3.8 g/dL (ref 3.5–5.0)
Alkaline Phosphatase: 68 U/L (ref 38–126)
Anion gap: 6 (ref 5–15)
BUN: 10 mg/dL (ref 6–20)
CHLORIDE: 104 mmol/L (ref 101–111)
CO2: 27 mmol/L (ref 22–32)
CREATININE: 0.91 mg/dL (ref 0.61–1.24)
Calcium: 8.9 mg/dL (ref 8.9–10.3)
GFR calc Af Amer: >60 mL/min (ref >60)
GFR calc non Af Amer: >60 mL/min (ref >60)
Glucose, Bld: 98 mg/dL (ref 65–99)
Potassium: 3.5 mmol/L (ref 3.5–5.1)
Sodium: 137 mmol/L (ref 135–145)
Total Bilirubin: 0.5 mg/dL (ref 0.3–1.2)
Total Protein: 7.4 g/dL (ref 6.5–8.1)

## 2017-03-17 LAB — SALICYLATE LEVEL: Salicylate Lvl: <7 mg/dL (ref 2.8–30.0)

## 2017-03-17 LAB — ETHANOL: Alcohol, Ethyl (B): <5 mg/dL (ref <5)

## 2017-03-17 LAB — CBC
HEMATOCRIT: 44.1 % (ref 40.0–52.0)
HEMOGLOBIN: 14.8 g/dL (ref 13.0–18.0)
MCH: 28.4 pg (ref 26.0–34.0)
MCHC: 33.6 g/dL (ref 32.0–36.0)
MCV: 84.6 fL (ref 80.0–100.0)
Platelets: 219 10*3/uL (ref 150–440)
RBC: 5.21 MIL/uL (ref 4.40–5.90)
RDW: 14.4 % (ref 11.5–14.5)
WBC: 6.4 10*3/uL (ref 3.8–10.6)

## 2017-03-17 LAB — ACETAMINOPHEN LEVEL: Acetaminophen (Tylenol), Serum: <10 ug/mL — ABNORMAL LOW (ref 10–30)

## 2017-03-17 MED ORDER — DOXYCYCLINE HYCLATE 100 MG PO TABS
100.0000 mg | ORAL_TABLET | Freq: Two times a day (BID) | ORAL | Status: DC
  Administered 2017-03-17 – 2017-03-18 (×2): 100 mg via ORAL
  Filled 2017-03-17 (×2): qty 1

## 2017-03-17 MED ORDER — DOXYCYCLINE HYCLATE 100 MG PO TABS: 100.0000 mg | ORAL_TABLET | Freq: Two times a day (BID) | ORAL | 0 refills | Status: DC

## 2017-03-17 NOTE — BH Assessment (Signed)
Assessment Note  Isaiah RutterGary W Rape Jr. is an 36 y.o. male. Isaiah Green states that he is here this evening due to people had told him that he was paying his light bill and they wasn't. So instead of hurting and killing people, I hurt myself.  He denied symptoms of depression. He denied symptoms of anxiety.  He denied having auditory or visual hallucinations.  He denied suicidal or homicidal ideation or intent.  He denied the use of alcohol or drugs.  He reports stressors.  He lost his kids, he has a 50B on him from his girlfriend.  His truck has been taken.  His lights have been shut off, and people are lying on him. Isaiah Green was uncooperative with the assessor and was verbally aggressive.  He denied needing to be at the hospital stating, "I shoulda never got in the ambulance".  Diagnosis: Adjustment Disorder  Past Medical History:  Past Medical History:  Diagnosis Date  . H/o Lyme disease   . History of TIAs   . Hypertension     No past surgical history on file.  Family History: No family history on file.  Social History:  reports that he has been smoking Cigars.  He has been smoking about 1.00 pack per day. He has never used smokeless tobacco. He reports that he uses drugs, including IV. He reports that he does not drink alcohol.  Additional Social History:  Alcohol / Drug Use History of alcohol / drug use?:  (Patient was uncooperative and would not answer)  CIWA: CIWA-Ar BP: (!) 145/98 Pulse Rate: (!) 106 COWS:    Allergies:  Allergies  Allergen Reactions  . Aspirin   . Penicillins Swelling    Has patient had a PCN reaction causing immediate rash, facial/tongue/throat swelling, SOB or lightheadedness with hypotension: yes, throat swelling Has patient had a PCN reaction causing severe rash involving mucus membranes or skin necrosis: no Has patient had a PCN reaction that required hospitalization: no Has patient had a PCN reaction occurring within the last 10 years: no If all of the  above answers are "NO", then may proceed with Cephalosporin use.   . Tegretol [Carbamazepine]     Home Medications:  (Not in a hospital admission)  OB/GYN Status:  No LMP for male patient.  General Assessment Data Location of Assessment: [facility] Assessment: In system Is this a Tele or Face-to-Face Assessment?: Face-to-Face Is this an Initial Assessment or a Re-assessment for this encounter?: Initial Assessment Marital status: Single Maiden name: n/a Is patient pregnant?: No Pregnancy Status: No Living Arrangements: Alone Can pt return to current living arrangement?: Yes Admission Status: Voluntary Is patient capable of signing voluntary admission?: Yes Referral Source: Self/Family/Friend Insurance type: None  Medical Screening Exam [facility]) Medical Exam completed: Yes  Crisis Care Plan Living Arrangements: Alone Legal Guardian: Other: (Self) Name of Psychiatrist: None Name of Therapist: None  Education Status Is patient currently in school?: No Current Grade: n/a Highest grade of school patient has completed: 8th Name of school: N/A Contact person: n/a  Risk to self with the past 6 months Suicidal Ideation: No Has patient been a risk to self within the past 6 months prior to admission? : No Suicidal Intent: No Has patient had any suicidal intent within the past 6 months prior to admission? : No Is patient at risk for suicide?: No Suicidal Plan?: No Has patient had any suicidal plan within the past 6 months prior to admission? : No Access  to Means: No What has been your use of drugs/alcohol within the last 12 months?: refused to answer Previous Attempts/Gestures: Yes How many times?: 1 Other Self Harm Risks: cuts Triggers for Past Attempts: Unknown Intentional Self Injurious Behavior: Cutting Family Suicide History: Unknown Recent stressful life event(s): Conflict (Comment), Legal Issues, Financial Problems Persecutory voices/beliefs?:  No Depression: No Depression Symptoms:  (Denied) Substance abuse history and/or treatment for substance abuse?: No Suicide prevention information given to non-admitted patients: Not applicable  Risk to Others within the past 6 months Homicidal Ideation: No Does patient have any lifetime risk of violence toward others beyond the six months prior to admission? : No Thoughts of Harm to Others: No Current Homicidal Intent: No Current Homicidal Plan: No Access to Homicidal Means: No Identified Victim: None identified History of harm to others?: No Assessment of Violence: None Noted Does patient have access to weapons?: Yes (Comment) (He reports that he has a collections of knives) Criminal Charges Pending?: Yes Describe Pending Criminal Charges: Multiple traffic violations, assault on a male Does patient have a court date: Yes Court Date: 03/21/17 (July 25 , August 17) Is patient on probation?: Unknown  Psychosis Hallucinations: None noted Delusions: None noted  Mental Status Report Appearance/Hygiene: In scrubs Eye Contact: Poor (Refuesed to look at assessor) Motor Activity: Unremarkable Speech: Loud, Aggressive Level of Consciousness: Irritable Mood: Angry Affect: Angry Anxiety Level: None Thought Processes: Coherent Judgement: Impaired Orientation: Unable to assess Obsessive Compulsive Thoughts/Behaviors: None  Cognitive Functioning Concentration: Unable to Assess Memory: Unable to Assess IQ: Average Vegetative Symptoms: Unable to Assess  ADLScreening Isaiah Green) Patient's cognitive ability adequate to safely complete daily activities?: Yes Patient able to express need for assistance with ADLs?: Yes Independently performs ADLs?: Yes (appropriate for developmental age)  Prior Inpatient Therapy Prior Inpatient Therapy:  (Unknown) Prior Therapy Dates: n/a Prior Therapy Facilty/Provider(s): n/a Reason for Treatment: n/a  Prior Outpatient  Therapy Prior Outpatient Therapy:  (unknown) Prior Therapy Dates: n/a Prior Therapy Facilty/Provider(s): n/a Reason for Treatment: n/a Does patient have an ACCT team?: Unknown Does patient have Intensive In-House Green?  : Unknown Does patient have [facility] Green? : Unknown Does patient have [facility] Green?: Unknown  ADL Screening (condition at time of admission) Patient's cognitive ability adequate to safely complete daily activities?: Yes Patient able to express need for assistance with ADLs?: Yes Independently performs ADLs?: Yes (appropriate for developmental age)       Abuse/Neglect Assessment (Assessment to be complete while patient is alone) Physical Abuse: Denies Verbal Abuse: Denies Sexual Abuse: Denies Exploitation of patient/patient's resources: Denies Self-Neglect: Denies          Additional Information 1:1 In Past 12 Months?: No CIRT Risk: No Elopement Risk: No Does patient have medical clearance?: Yes     Disposition:  Disposition Initial Assessment Completed for this Encounter: Yes Disposition of Patient: Other dispositions  On Site Evaluation by:   Reviewed with Physician:    Justice Deeds 03/17/2017 9:23 PM

## 2017-03-17 NOTE — ED Notes (Signed)
PT IVC PENDING CONSULT  

## 2017-03-17 NOTE — ED Notes (Signed)
The patient was dressed out into the required purple scrubs. His belongings were placed into a white patient belongings bag and labeled properly. Patient was cooperative throughout the process.  Belongings were left with the RN at her computer station.  Two bags total.

## 2017-03-17 NOTE — ED Triage Notes (Signed)
Pt [place] via [facility] from [place] with lacerations to his right wrist - 2 superficial cuts and one deeper laceration  IVC papers in place

## 2017-03-17 NOTE — ED Notes (Signed)

## 2017-03-17 NOTE — ED Notes (Addendum)
Patient admitted to unit after recent suicide attempt.  Patient is anxious but cooperative.  Patient currently denies SI/HI/AVH.  Patient given meal tray and oriented to unit.   No distress noted.

## 2017-03-17 NOTE — ED Notes (Signed)
BEHAVIORAL HEALTH ROUNDING Patient sleeping: No. Patient alert and oriented: yes Behavior appropriate: Yes.  ; If no, describe:  Nutrition and fluids offered: yes Toileting and hygiene offered: Yes  Sitter present: q15 minute observations and security  monitoring Law enforcement present: Yes  ODS  

## 2017-03-17 NOTE — ED Provider Notes (Signed)
[facility] Emergency Department Provider Note  Time seen: 6:54 PM  I have reviewed the triage vital signs and the nursing notes.   HISTORY  Chief Complaint Suicidal and Psychiatric Evaluation    HPI Isaiah RutterGary W Buonomo Jr. is a 36 y.o. male with a past medical history of hypertension, TIA, history of Lyme disease in the past, substance use, presents to the emergency department after cutting his right wrist. According to the patient he states he was napping and accidentally cut his right wrist, states he got upset and took a piece of glass and cut his right wrist even more. States he is not sure why he did it but denies suicidal or homicidal ideation. Patient does state recent methamphetamine use, states a history of heroin abuse but is not used any heroin recently. Denies alcohol use. Patient denies any medical complaints today besides recent tick bites and now with a rash to the right upper extremity. Denies fever.  Past Medical History:  Diagnosis Date  . H/o Lyme disease   . History of TIAs   . Hypertension     There are no active problems to display for this patient.   No past surgical history on file.  Prior to Admission medications   Medication Sig Start Date End Date Taking? Authorizing Provider  naproxen (NAPROSYN) 500 MG tablet Take 1 tablet (500 mg total) by mouth 2 (two) times daily with a meal. 06/24/16   Triplett, Cari B, FNP  tiZANidine (ZANAFLEX) 4 MG tablet Take 1 tablet (4 mg total) by mouth 3 (three) times daily. 06/24/16   Chinita Pesterriplett, Cari B, FNP    Allergies  Allergen Reactions  . Aspirin   . Penicillins Swelling    Has patient had a PCN reaction causing immediate rash, facial/tongue/throat swelling, SOB or lightheadedness with hypotension: yes, throat swelling Has patient had a PCN reaction causing severe rash involving mucus membranes or skin necrosis: no Has patient had a PCN reaction that required hospitalization: no Has patient had a PCN  reaction occurring within the last 10 years: no If all of the above answers are "NO", then may proceed with Cephalosporin use.   . Tegretol [Carbamazepine]     No family history on file.  Social History Social History  Substance Use Topics  . Smoking status: Current Every Day Smoker    Packs/day: 1.00    Types: Cigars  . Smokeless tobacco: Never Used  . Alcohol use No    Review of Systems Constitutional: Negative for fever. Eyes: Negative for visual changes. ENT: Mild congestion per patient Cardiovascular: Negative for chest pain. Respiratory: Negative for shortness of breath. Gastrointestinal: Negative for abdominal pain Genitourinary: Negative for dysuria. Musculoskeletal: Negative for back pain. Skin: Rash to right upper extremity around tick bite Neurological: Negative for headache All other ROS negative  ____________________________________________   PHYSICAL EXAM:  VITAL SIGNS: ED Triage Vitals  Enc Vitals Group     BP 03/17/17 1725 (!) 145/98     Pulse Rate 03/17/17 1725 (!) 106     Resp 03/17/17 1725 17     Temp 03/17/17 1725 98.6 F (37 C)     Temp Source 03/17/17 1725 Oral     SpO2 03/17/17 1725 99 %     Weight 03/17/17 1726 178 lb (80.7 kg)     Height 03/17/17 1726 6\' 2"  (1.88 m)     Head Circumference --      Peak Flow --      Pain Score  03/17/17 1752 2     Pain Loc --      Pain Edu? --      Excl. in GC? --     Constitutional: Alert and oriented. No distress. Disheveled appearing. Eyes: Normal exam ENT   Head: Normocephalic and atraumatic.   Mouth/Throat: Mucous membranes are moist. Cardiovascular: Normal rate, regular rhythm. No murmur Respiratory: Normal respiratory effort without tachypnea nor retractions. Breath sounds are clear  Gastrointestinal: Soft and nontender. No distention.   Musculoskeletal: Nontender with normal range of motion in all extremities. Patient does have a approximate 2 cm laceration to the right lateral wrist.  Hemostatic. Patient has an older laceration just distal to the new laceration which she states is several days old. Neurologic:  Normal speech and language. No gross focal neurologic deficits Skin:  Patient does have several bite areas to the abdomen as well as right upper extremity which she states are from tick bites. Patient does have an almost petechial type rash to the proximal right upper extremity but nowhere else on body. Psychiatric: Mood and affect are normal.   ____________________________________________     INITIAL IMPRESSION / ASSESSMENT AND PLAN / ED COURSE  Pertinent labs & imaging results that were available during my care of the patient were reviewed by me and considered in my medical decision making (see chart for details).  Patient presents to the emergency department with self-injurious behavior which he admits to. We will maintain the involuntary commitment order and have the patient seen by psychiatry. Otherwise the patient appears well, he does have a rash and states recent bites. We will cover with doxycycline. Currently awaiting psychiatric evaluation. Patient's labs are largely within normal limits.  As far as the patient's laceration is approximately 2 cm in length, hemostatic, somewhat superficial we will repair with Steri-Strips. Patient states his last tetanus shot was within the past 2 years.  ____________________________________________   FINAL CLINICAL IMPRESSION(S) / ED DIAGNOSES  Tick bite Self mutilation    Minna Antis, MD 03/17/17 2236

## 2017-03-18 DIAGNOSIS — F1110 Opioid abuse, uncomplicated: Secondary | ICD-10-CM

## 2017-03-18 DIAGNOSIS — F1994 Other psychoactive substance use, unspecified with psychoactive substance-induced mood disorder: Secondary | ICD-10-CM

## 2017-03-18 DIAGNOSIS — F1510 Other stimulant abuse, uncomplicated: Secondary | ICD-10-CM

## 2017-03-18 DIAGNOSIS — S61519A Laceration without foreign body of unspecified wrist, initial encounter: Secondary | ICD-10-CM

## 2017-03-18 DIAGNOSIS — X789XXA Intentional self-harm by unspecified sharp object, initial encounter: Secondary | ICD-10-CM

## 2017-03-18 LAB — URINE DRUG SCREEN, QUALITATIVE (ARMC ONLY)
AMPHETAMINES, UR SCREEN: POSITIVE — AB
BENZODIAZEPINE, UR SCRN: NOT DETECTED
Barbiturates, Ur Screen: NOT DETECTED
Cannabinoid 50 Ng, Ur ~~LOC~~: NOT DETECTED
Cocaine Metabolite,Ur ~~LOC~~: NOT DETECTED
MDMA (Ecstasy)Ur Screen: NOT DETECTED
METHADONE SCREEN, URINE: NOT DETECTED
OPIATE, UR SCREEN: NOT DETECTED
Phencyclidine (PCP) Ur S: NOT DETECTED
Tricyclic, Ur Screen: NOT DETECTED

## 2017-03-18 NOTE — ED Provider Notes (Signed)
-----------------------------------------   6:52 AM on 03/18/2017 -----------------------------------------   Blood pressure (!) 137/91, pulse 78, temperature 97.6 F (36.4 C), temperature source Oral, resp. rate 20, height 6\' 2"  (1.88 m), weight 80.7 kg (178 lb), SpO2 98 %.  The patient had no acute events since last update.  Calm and cooperative at this time.  Disposition is pending Psychiatry/Behavioral Medicine team recommendations.     Irean HongSung, Afton Lavalle J, MD 03/18/17 [phone]

## 2017-03-18 NOTE — ED Notes (Addendum)
Patient resting quietly in room with tv on. Pt voices no complaints. Lacerations noted to right wrist without signs of infection. Pt ate 100% breakfast and reports that he's still hungry. Pt given extra sandwich with juice and extra blanket upon request. Pt denies SI/HI and A/V hallucinations at this time. Pt encouraged to voice concerns and ask questions. Pt remains safe with 15 minute checks.

## 2017-03-18 NOTE — ED Provider Notes (Signed)
Dr. Toni Amendlapacs has released the patient from IVC.   Merrily Brittleifenbark, Lamount Bankson, MD 03/18/17 1221
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
## 2017-03-18 NOTE — ED Notes (Signed)
Cindy, pt's sister, called and was concerned about pt being discharged. Cindy told Clinical research associatewriter  that patient stated to his daughter, "I'm tired of life. Patent attorney" Writer and nurse Osie spoke with pt and asked pt if he had any SI/HI. Pt verbalizes, "no" and that his family just wanted him committed. Patient reports that he is planning to get help and that he received info from "deputy in the ED".

## 2017-03-18 NOTE — ED Notes (Signed)
IVC  PAPERS  RESCINDED  PER  DR  CLAPACS  INFORMED  NURES  [facility]  AND [facility] PHY[facility]

## 2017-03-18 NOTE — ED Notes (Addendum)
Dr. Clapacs in room with patient 

## 2017-03-18 NOTE — BH Assessment (Signed)
Per Dr.Clapacs, pt is recommended for d/c.

## 2017-03-18 NOTE — Discharge Instructions (Signed)
Please follow-up with a primary care physician in 2 days for recheck. Return to the emergency department sooner for any concerns.

## 2017-03-18 NOTE — ED Notes (Signed)
Daughter here to see patient.

## 2017-03-18 NOTE — ED Notes (Addendum)
Pt discharged to lobby. Pt was stable at that time. All papers and prescriptions were given and valuables returned. Verbal understanding expressed. Denies SI/HI and A/VH. Pt given opportunity to express concerns and ask questions.

## 2017-03-21 ENCOUNTER — Emergency Department: Payer: Self-pay

## 2017-03-21 ENCOUNTER — Encounter: Payer: Self-pay | Admitting: Intensive Care

## 2017-03-21 ENCOUNTER — Emergency Department
Admission: EM | Admit: 2017-03-21 | Discharge: 2017-03-21 | Disposition: A | Discharge status: Home / Self Care | Payer: Self-pay | Attending: Emergency Medicine | Admitting: Emergency Medicine

## 2017-03-21 DIAGNOSIS — R109 Unspecified abdominal pain: Secondary | ICD-10-CM

## 2017-03-21 DIAGNOSIS — F17290 Nicotine dependence, other tobacco product, uncomplicated: Secondary | ICD-10-CM | POA: Insufficient documentation

## 2017-03-21 DIAGNOSIS — R1031 Right lower quadrant pain: Secondary | ICD-10-CM | POA: Insufficient documentation

## 2017-03-21 DIAGNOSIS — Z8673 Personal history of transient ischemic attack (TIA), and cerebral infarction without residual deficits: Secondary | ICD-10-CM | POA: Insufficient documentation

## 2017-03-21 DIAGNOSIS — I10 Essential (primary) hypertension: Secondary | ICD-10-CM | POA: Insufficient documentation

## 2017-03-21 LAB — URINALYSIS, COMPLETE (UACMP) WITH MICROSCOPIC
BILIRUBIN URINE: NEGATIVE
GLUCOSE, UA: 50 mg/dL — AB
HGB URINE DIPSTICK: NEGATIVE
KETONES UR: NEGATIVE mg/dL
Leukocytes, UA: NEGATIVE
Nitrite: NEGATIVE
PH: 6 (ref 5.0–8.0)
PROTEIN: NEGATIVE mg/dL
Specific Gravity, Urine: 1.021 (ref 1.005–1.030)

## 2017-03-21 LAB — BASIC METABOLIC PANEL
ANION GAP: 6 (ref 5–15)
BUN: 13 mg/dL (ref 6–20)
CO2: 27 mmol/L (ref 22–32)
Calcium: 9 mg/dL (ref 8.9–10.3)
Chloride: 106 mmol/L (ref 101–111)
Creatinine, Ser: 0.91 mg/dL (ref 0.61–1.24)
GFR calc non Af Amer: >60 mL/min (ref >60)
Glucose, Bld: 132 mg/dL — ABNORMAL HIGH (ref 65–99)
POTASSIUM: 4 mmol/L (ref 3.5–5.1)
Sodium: 139 mmol/L (ref 135–145)

## 2017-03-21 LAB — CBC
HEMATOCRIT: 44.9 % (ref 40.0–52.0)
HEMOGLOBIN: 15.3 g/dL (ref 13.0–18.0)
MCH: 28.8 pg (ref 26.0–34.0)
MCHC: 34 g/dL (ref 32.0–36.0)
MCV: 84.7 fL (ref 80.0–100.0)
Platelets: 263 10*3/uL (ref 150–440)
RBC: 5.31 MIL/uL (ref 4.40–5.90)
RDW: 14.4 % (ref 11.5–14.5)
WBC: 7.9 10*3/uL (ref 3.8–10.6)

## 2017-03-21 MED ORDER — KETOROLAC TROMETHAMINE 30 MG/ML IJ SOLN
INTRAMUSCULAR | Status: AC
  Administered 2017-03-21: 30 mg via INTRAVENOUS
  Filled 2017-03-21: qty 1

## 2017-03-21 MED ORDER — KETOROLAC TROMETHAMINE 30 MG/ML IJ SOLN
30.0000 mg | Freq: Once | INTRAMUSCULAR | Status: AC
  Administered 2017-03-21: 30 mg via INTRAVENOUS

## 2017-03-21 MED ORDER — SODIUM CHLORIDE 0.9 % IV BOLUS (SEPSIS)
1000.0000 mL | Freq: Once | INTRAVENOUS | Status: AC
  Administered 2017-03-21: 1000 mL via INTRAVENOUS

## 2017-03-21 NOTE — ED Provider Notes (Signed)
[facility] Emergency Department Provider Note   ____________________________________________   I have reviewed the triage vital signs and the nursing notes.   HISTORY  Chief Complaint Flank Pain and Withdrawal   History limited by: Not Limited   HPI Isaiah RutterGary W Basham Jr. is a 36 y.o. male who presents to the emergency department today with primary concerns for right lower back, flank and abdominal pain. Patient states the symptoms became severe today. The patient also states he has been having difficulty with urination. Does have a history of kidney stones. The patient additionally states he has not been feeling well for a number of days. He states that he was recently diagnosed with tickborne illness. Was seen in the emergency department roughly 5 days ago. He has not filled his prescriptions. He has had fever.   Past Medical History:  Diagnosis Date  . H/o Lyme disease   . History of TIAs   . Hypertension     Patient Active Problem List   Diagnosis Date Noted  . 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 Opiate abuse, episodic 03/18/2017  . Substance induced mood disorder ([facility]) 03/18/2017  . Self-inflicted laceration of wrist 03/18/2017    History reviewed. No pertinent surgical history.  Prior to Admission medications   Medication Sig Start Date End Date Taking? Authorizing Provider  doxycycline (VIBRA-TABS) 100 MG tablet Take 1 tablet (100 mg total) by mouth 2 (two) times daily. 03/17/17  Yes Minna AntisPaduchowski, Kevin, MD  naproxen (NAPROSYN) 500 MG tablet Take 1 tablet (500 mg total) by mouth 2 (two) times daily with a meal. Patient not taking: Reported on 03/18/2017 06/24/16   Kem Boroughsriplett, Cari B, FNP  tiZANidine (ZANAFLEX) 4 MG tablet Take 1 tablet (4 mg total) by mouth 3 (three) times daily. Patient not taking: Reported on 03/18/2017 06/24/16   Kem Boroughsriplett, Cari B, FNP    Allergies Aspirin; Penicillins; and Tegretol [carbamazepine]  History reviewed. No pertinent  family history.  Social History Social History  Substance Use Topics  . Smoking status: Current Every Day Smoker    Packs/day: 1.00    Types: Cigars  . Smokeless tobacco: Never Used  . Alcohol use No    Review of Systems Constitutional: Positive for fever. Eyes: No visual changes. ENT: No sore throat. Cardiovascular: Denies chest pain. Respiratory: Denies shortness of breath. Gastrointestinal: Positive for abdominal pain. Genitourinary: Positive for urinary retention. Musculoskeletal: Positive for back pain. Skin: Negative for rash. Neurological: Negative for headaches, focal weakness or numbness.  ____________________________________________   PHYSICAL EXAM:  VITAL SIGNS: ED Triage Vitals  Enc Vitals Group     BP 03/21/17 1355 (!) 143/97     Pulse Rate 03/21/17 1355 (!) 104     Resp 03/21/17 1355 20     Temp 03/21/17 1355 98 F (36.7 C)     Temp Source 03/21/17 1355 Oral     SpO2 03/21/17 1355 98 %     Weight 03/21/17 1355 180 lb (81.6 kg)     Height 03/21/17 1355 6\' 2"  (1.88 m)     Head Circumference --      Peak Flow --      Pain Score 03/21/17 1354 7   Constitutional: Alert and oriented. Well appearing and in no distress. Eyes: Conjunctivae are normal.  ENT   Head: Normocephalic and atraumatic.   Nose: No congestion/rhinnorhea.   Mouth/Throat: Mucous membranes are moist.   Neck: No stridor. Hematological/Lymphatic/Immunilogical: No cervical lymphadenopathy. Cardiovascular: Normal rate, regular rhythm.  No murmurs, rubs, or  gallops.  Respiratory: Normal respiratory effort without tachypnea nor retractions. Breath sounds are clear and equal bilaterally. No wheezes/rales/rhonchi. Gastrointestinal: Soft and non tender. No rebound. No guarding.  Genitourinary: Deferred Musculoskeletal: Normal range of motion in all extremities. No lower extremity edema. Neurologic:  Normal speech and language. No gross focal neurologic deficits are appreciated.   Skin:  Skin is warm, dry and intact. No rash noted. Psychiatric: Patient denies SI  ____________________________________________    LABS (pertinent positives/negatives)  Labs Reviewed  URINALYSIS, COMPLETE (UACMP) WITH MICROSCOPIC - Abnormal; Notable for the following:       Result Value   Color, Urine YELLOW (*)    APPearance HAZY (*)    Glucose, UA 50 (*)    Bacteria, UA RARE (*)    Squamous Epithelial / LPF 0-5 (*)    All other components within normal limits  BASIC METABOLIC PANEL - Abnormal; Notable for the following:    Glucose, Bld 132 (*)    All other components within normal limits  CBC     ____________________________________________   EKG  I, Phineas Semen, attending physician, personally viewed and interpreted this EKG  EKG Time: 1403 Rate: 93 Rhythm: normal sinus rhythm Axis: normal Intervals: qtc 432 QRS: narrow ST changes: no st elevation Impression: normal ekg   ____________________________________________    RADIOLOGY  CT renal IMPRESSION: Tiny bilateral renal calculi without obstructive change. No ureteral stones are seen.   ____________________________________________   PROCEDURES  Procedures  ____________________________________________   INITIAL IMPRESSION / ASSESSMENT AND PLAN / ED COURSE  Pertinent labs & imaging results that were available during my care of the patient were reviewed by me and considered in my medical decision making (see chart for details).  Patient presented to the emergency department today with concerns for early for right flank pain. Blood work urine and CT renal stone without concerning findings. At this point unclear etiology of the patient's pain. Do think it is safe for patient to be discharged home. Patient denies any SI at this time.  ____________________________________________   FINAL CLINICAL IMPRESSION(S) / ED DIAGNOSES  Final diagnoses:  Right flank pain     Note: This dictation  was prepared with Dragon dictation. Any transcriptional errors that result from this process are unintentional     Phineas Semen, MD 03/21/17 2000

## 2017-03-21 NOTE — Discharge Instructions (Signed)
Please seek medical attention for any high fevers, chest pain, shortness of breath, change in behavior, persistent vomiting, bloody stool or any other new or concerning symptoms.  

## 2017-03-21 NOTE — ED Notes (Signed)
Patient states he feels dehydrated and has dry mouth and that his daughter "found me unresponsive and sweaty" at home "before my court appearance".  Pt states he has "tick fever" and pointed out several lesions on his arms and torso and stated he had been treated for it with antibiotics.  He said his prescription for 20 pills was $120 and he could not afford it.

## 2017-03-21 NOTE — ED Notes (Addendum)
Patient is an IV drug user (meth) and reports he has not used since Monday. Diaphoresis on and off

## 2017-03-21 NOTE — ED Triage Notes (Signed)
Patient presents to ER with c/o R sided flank pain and urinary retention. Patient also states he is currently having withdrawals. Last use of meth was a couple of days ago. Diaphoretic in triage. A&O x4

## 2017-03-21 NOTE — ED Notes (Signed)
Urged patient to void. 

## 2017-06-18 ENCOUNTER — Emergency Department: Payer: Self-pay

## 2017-06-18 ENCOUNTER — Encounter: Payer: Self-pay | Admitting: Intensive Care

## 2017-06-18 ENCOUNTER — Emergency Department
Admission: EM | Admit: 2017-06-18 | Discharge: 2017-06-18 | Disposition: A | Discharge status: Home / Self Care | Payer: Self-pay | Attending: Student in an Organized Health Care Education/Training Program | Admitting: Student in an Organized Health Care Education/Training Program

## 2017-06-18 DIAGNOSIS — L02413 Cutaneous abscess of right upper limb: Secondary | ICD-10-CM | POA: Insufficient documentation

## 2017-06-18 DIAGNOSIS — I10 Essential (primary) hypertension: Secondary | ICD-10-CM | POA: Insufficient documentation

## 2017-06-18 DIAGNOSIS — F17290 Nicotine dependence, other tobacco product, uncomplicated: Secondary | ICD-10-CM | POA: Insufficient documentation

## 2017-06-18 DIAGNOSIS — L0291 Cutaneous abscess, unspecified: Secondary | ICD-10-CM

## 2017-06-18 LAB — COMPREHENSIVE METABOLIC PANEL
ALT: 19 U/L (ref 17–63)
AST: 23 U/L (ref 15–41)
Albumin: 4.6 g/dL (ref 3.5–5.0)
Alkaline Phosphatase: 72 U/L (ref 38–126)
Anion gap: 9 (ref 5–15)
BUN: 22 mg/dL — ABNORMAL HIGH (ref 6–20)
CHLORIDE: 103 mmol/L (ref 101–111)
CO2: 27 mmol/L (ref 22–32)
Calcium: 9.4 mg/dL (ref 8.9–10.3)
Creatinine, Ser: 0.96 mg/dL (ref 0.61–1.24)
Glucose, Bld: 122 mg/dL — ABNORMAL HIGH (ref 65–99)
POTASSIUM: 3.9 mmol/L (ref 3.5–5.1)
SODIUM: 139 mmol/L (ref 135–145)
Total Bilirubin: 0.6 mg/dL (ref 0.3–1.2)
Total Protein: 8.4 g/dL — ABNORMAL HIGH (ref 6.5–8.1)

## 2017-06-18 LAB — LACTIC ACID, PLASMA: LACTIC ACID, VENOUS: 1.9 mmol/L (ref 0.5–1.9)

## 2017-06-18 LAB — CBC WITH DIFFERENTIAL/PLATELET
Basophils Absolute: 0 10*3/uL (ref 0–0.1)
Basophils Relative: 1 %
Eosinophils Absolute: 0.1 10*3/uL (ref 0–0.7)
Eosinophils Relative: 1 %
HEMATOCRIT: 50.3 % (ref 40.0–52.0)
HEMOGLOBIN: 16.9 g/dL (ref 13.0–18.0)
LYMPHS PCT: 25 %
Lymphs Abs: 1.5 10*3/uL (ref 1.0–3.6)
MCH: 28.3 pg (ref 26.0–34.0)
MCHC: 33.5 g/dL (ref 32.0–36.0)
MCV: 84.6 fL (ref 80.0–100.0)
MONOS PCT: 11 %
Monocytes Absolute: 0.7 10*3/uL (ref 0.2–1.0)
NEUTROS PCT: 62 %
Neutro Abs: 3.7 10*3/uL (ref 1.4–6.5)
Platelets: 289 10*3/uL (ref 150–440)
RBC: 5.95 MIL/uL — ABNORMAL HIGH (ref 4.40–5.90)
RDW: 14.5 % (ref 11.5–14.5)
WBC: 6 10*3/uL (ref 3.8–10.6)

## 2017-06-18 MED ORDER — LIDOCAINE-EPINEPHRINE-TETRACAINE (LET) SOLUTION
NASAL | Status: AC
  Filled 2017-06-18: qty 3

## 2017-06-18 MED ORDER — BUPIVACAINE HCL (PF) 0.5 % IJ SOLN
INTRAMUSCULAR | Status: AC
  Filled 2017-06-18: qty 30

## 2017-06-18 MED ORDER — BUPIVACAINE-EPINEPHRINE (PF) 0.5% -1:200000 IJ SOLN: 30.0000 mL | Freq: Once | INTRAMUSCULAR | Status: DC

## 2017-06-18 MED ORDER — SULFAMETHOXAZOLE-TRIMETHOPRIM 800-160 MG PO TABS
ORAL_TABLET | ORAL | Status: AC
Start: 2017-06-18 — End: 2017-06-18
  Administered 2017-06-18: 1 via ORAL
  Filled 2017-06-18: qty 1

## 2017-06-18 MED ORDER — LIDOCAINE-EPINEPHRINE-TETRACAINE (LET) SOLUTION
3.0000 mL | Freq: Once | NASAL | Status: AC
  Administered 2017-06-18: 3 mL via TOPICAL

## 2017-06-18 MED ORDER — SULFAMETHOXAZOLE-TRIMETHOPRIM 800-160 MG PO TABS
1.0000 | ORAL_TABLET | Freq: Once | ORAL | Status: AC
  Administered 2017-06-18: 1 via ORAL

## 2017-06-18 MED ORDER — TRAMADOL HCL 50 MG PO TABS: 50.0000 mg | ORAL_TABLET | Freq: Four times a day (QID) | ORAL | 0 refills | Status: AC | PRN

## 2017-06-18 MED ORDER — SULFAMETHOXAZOLE-TRIMETHOPRIM 800-160 MG PO TABS: 1.0000 | ORAL_TABLET | Freq: Two times a day (BID) | ORAL | 0 refills | Status: AC

## 2017-06-18 NOTE — ED Notes (Signed)

## 2017-06-18 NOTE — ED Notes (Signed)
First nurse note   Presents with possible abscess area to right arm  States he developed abscess area about 1 week ago

## 2017-06-18 NOTE — ED Triage Notes (Addendum)
PAtient presents with Large abscess on R AC that appeared X1 week ago. Redness noted. No drainage at this time. Patient also reports he was diagnosed with Lyme disease X1 month ago and still experiencing sweats and body aches

## 2017-06-18 NOTE — ED Provider Notes (Signed)
[facility] Emergency Department Provider Note    First MD Initiated Contact with Patient 06/18/17 2120     (approximate)  I have reviewed the triage vital signs and the nursing notes.   HISTORY  Chief Complaint Abscess    HPI Tyreik Delahoussaye. is a 36 y.o. male history of IV drug abuse and abscesses presents with several weeks of worsening pain and swelling to an abscess in his right [facility] oriented has injected recently. No recent fevers.  Patient has been trying to drain and provide wound care at home. States he lanced it week ago deeply with a syringe and had large amount of purulence drained. Since then daily he's been pushing antibiotic ointment into the wound and trying to squeeze it to get it continued to drain. Came to the ER today due to worsening redness and pain and at the direction of his sister.   Past Medical History:  Diagnosis Date  . H/o Lyme disease   . History of TIAs   . Hypertension    History reviewed. No pertinent family history. History reviewed. No pertinent surgical history. Patient Active Problem List   Diagnosis Date Noted  . 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 Opiate abuse, episodic 03/18/2017  . Substance induced mood disorder (HCC) 03/18/2017  . Self-inflicted laceration of wrist 03/18/2017      Prior to Admission medications   Medication Sig Start Date End Date Taking? Authorizing Provider  doxycycline (VIBRA-TABS) 100 MG tablet Take 1 tablet (100 mg total) by mouth 2 (two) times daily. 03/17/17   Minna Antis, MD  naproxen (NAPROSYN) 500 MG tablet Take 1 tablet (500 mg total) by mouth 2 (two) times daily with a meal. Patient not taking: Reported on 03/18/2017 06/24/16   Kem Boroughs B, FNP  sulfamethoxazole-trimethoprim (BACTRIM DS,SEPTRA DS) 800-160 MG tablet Take 1 tablet by mouth 2 (two) times daily. 06/18/17 06/25/17  Willy Eddy, MD  tiZANidine (ZANAFLEX) 4 MG tablet Take 1 tablet (4 mg total) by mouth 3  (three) times daily. Patient not taking: Reported on 03/18/2017 06/24/16   Chinita Pester, FNP  traMADol (ULTRAM) 50 MG tablet Take 1 tablet (50 mg total) by mouth every 6 (six) hours as needed. 06/18/17 06/18/18  Willy Eddy, MD    Allergies Aspirin; Penicillins; and Tegretol [carbamazepine]    Social History Social History  Substance Use Topics  . Smoking status: Current Every Day Smoker    Packs/day: 1.00    Types: Cigars  . Smokeless tobacco: Never Used  . Alcohol use No    Review of Systems Patient denies headaches, rhinorrhea, blurry vision, numbness, shortness of breath, chest pain, edema, cough, abdominal pain, nausea, vomiting, diarrhea, dysuria, fevers, rashes or hallucinations unless otherwise stated above in HPI. ____________________________________________   PHYSICAL EXAM:  VITAL SIGNS: Vitals:   06/18/17 1851  BP: (!) 161/113  Pulse: (!) 119  Resp: 20  Temp: 97.9 F (36.6 C)  SpO2: 99%    Constitutional: Alert and oriented.  in no acute distress. Eyes: Conjunctivae are normal.  Head: Atraumatic. Nose: No congestion/rhinnorhea. Mouth/Throat: Mucous membranes are moist.   Neck: Painless ROM.  Cardiovascular:   Good peripheral circulation. Respiratory: Normal respiratory effort.  No retractions.  Gastrointestinal: Soft and nontender.  Musculoskeletal: No lower extremity tenderness .  No joint effusions. Neurologic:  Normal speech and language. No gross focal neurologic deficits are appreciated.  Skin:  3cm large indurated fluctuant mass/abscess to medial right AC without purulent drainage, + overlying erythema,  abscess previously drained with non purulent laceration  Psychiatric: Mood and affect are normal. Speech and behavior are normal.  ____________________________________________   LABS (all labs ordered are listed, but only abnormal results are displayed)  Results for orders placed or performed during the hospital encounter of 06/18/17  (from the past 24 hour(s))  Lactic acid, plasma     Status: None   Collection Time: 06/18/17  6:59 PM  Result Value Ref Range   Lactic Acid, Venous 1.9 0.5 - 1.9 mmol/L  Comprehensive metabolic panel     Status: Abnormal   Collection Time: 06/18/17  6:59 PM  Result Value Ref Range   Sodium 139 135 - 145 mmol/L   Potassium 3.9 3.5 - 5.1 mmol/L   Chloride 103 101 - 111 mmol/L   CO2 27 22 - 32 mmol/L   Glucose, Bld 122 (H) 65 - 99 mg/dL   BUN 22 (H) 6 - 20 mg/dL   Creatinine, Ser 1.61 0.61 - 1.24 mg/dL   Calcium 9.4 8.9 - 09.6 mg/dL   Total Protein 8.4 (H) 6.5 - 8.1 g/dL   Albumin 4.6 3.5 - 5.0 g/dL   AST 23 15 - 41 U/L   ALT 19 17 - 63 U/L   Alkaline Phosphatase 72 38 - 126 U/L   Total Bilirubin 0.6 0.3 - 1.2 mg/dL   GFR calc non Af Amer >60 >60 mL/min   GFR calc Af Amer >60 >60 mL/min   Anion gap 9 5 - 15  CBC with Differential     Status: Abnormal   Collection Time: 06/18/17  6:59 PM  Result Value Ref Range   WBC 6.0 3.8 - 10.6 K/uL   RBC 5.95 (H) 4.40 - 5.90 MIL/uL   Hemoglobin 16.9 13.0 - 18.0 g/dL   HCT 04.5 40.9 - 81.1 %   MCV 84.6 80.0 - 100.0 fL   MCH 28.3 26.0 - 34.0 pg   MCHC 33.5 32.0 - 36.0 g/dL   RDW 91.4 78.2 - 95.6 %   Platelets 289 150 - 440 K/uL   Neutrophils Relative % 62 %   Neutro Abs 3.7 1.4 - 6.5 K/uL   Lymphocytes Relative 25 %   Lymphs Abs 1.5 1.0 - 3.6 K/uL   Monocytes Relative 11 %   Monocytes Absolute 0.7 0.2 - 1.0 K/uL   Eosinophils Relative 1 %   Eosinophils Absolute 0.1 0 - 0.7 K/uL   Basophils Relative 1 %   Basophils Absolute 0.0 0 - 0.1 K/uL   ____________________________________________ _________________________________  RADIOLOGY  I personally reviewed all radiographic images ordered to evaluate for the above acute complaints and reviewed radiology reports and findings.  These findings were personally discussed with the patient.  Please see medical record for radiology  report.  ____________________________________________   PROCEDURES  Procedure(s) performed:  Marland KitchenMarland KitchenIncision and Drainage Date/Time: 06/18/2017 10:15 PM Performed by: Willy Eddy Authorized by: Willy Eddy   Consent:    Consent obtained:  Verbal   Consent given by:  Patient   Risks discussed:  Incomplete drainage, infection and pain Location:    Type:  Abscess   Location:  Upper extremity   Upper extremity location:  Elbow   Elbow location:  R elbow Pre-procedure details:    Skin preparation:  Betadine Anesthesia (see MAR for exact dosages):    Anesthesia method:  Topical application and local infiltration   Topical anesthetic:  LET   Local anesthetic:  Bupivacaine 0.5% w/o epi Procedure type:    Complexity:  Simple Procedure details:    Incision types:  Single straight   Incision depth:  Subcutaneous   Scalpel blade:  11   Wound management:  Probed and deloculated, irrigated with saline and extensive cleaning   Drainage:  Bloody   Drainage amount:  Scant   Wound treatment:  Wound left open   Packing materials:  1/4 in iodoform gauze   Amount 1/4" iodoform:  7" Post-procedure details:    Patient tolerance of procedure:  Tolerated well, no immediate complications      Critical Care performed: no ____________________________________________   INITIAL IMPRESSION / ASSESSMENT AND PLAN / ED COURSE  Pertinent labs & imaging results that were available during my care of the patient were reviewed by me and considered in my medical decision making (see chart for details).  DDX: abscess, carbuncle, cellulitis, retained FB, nec fasc  Dalante Lebsack. is a 36 y.o. who presents to the ED with abscess as described above. Afebrile and otherwise he will dynamically stable. Blood work is reassuring. X-ray shows no evidence of retained foreign body. Does have significant amount of scar tissue as described above. Not clinically consistent with neck fashion.  Patient will  be started on Bactrim. Patient will return to the ER for wound check and removal of packing. Discussed signs and symptoms for which she should return to the hospital.      ____________________________________________   FINAL CLINICAL IMPRESSION(S) / ED DIAGNOSES  Final diagnoses:  Abscess      NEW MEDICATIONS STARTED DURING THIS VISIT:  New Prescriptions   SULFAMETHOXAZOLE-TRIMETHOPRIM (BACTRIM DS,SEPTRA DS) 800-160 MG TABLET    Take 1 tablet by mouth 2 (two) times daily.   TRAMADOL (ULTRAM) 50 MG TABLET    Take 1 tablet (50 mg total) by mouth every 6 (six) hours as needed.     Note:  This document was prepared using Dragon voice recognition software and may include unintentional dictation errors.     Willy Eddy, MD 06/18/17 2217

## 2017-10-09 ENCOUNTER — Other Ambulatory Visit: Payer: Self-pay

## 2017-10-09 ENCOUNTER — Emergency Department
Admission: EM | Admit: 2017-10-09 | Discharge: 2017-10-09 | Disposition: A | Discharge status: Home / Self Care | Payer: Self-pay | Attending: Emergency Medicine | Admitting: Emergency Medicine

## 2017-10-09 DIAGNOSIS — Z79899 Other long term (current) drug therapy: Secondary | ICD-10-CM | POA: Insufficient documentation

## 2017-10-09 DIAGNOSIS — I10 Essential (primary) hypertension: Secondary | ICD-10-CM | POA: Insufficient documentation

## 2017-10-09 DIAGNOSIS — F4321 Adjustment disorder with depressed mood: Secondary | ICD-10-CM | POA: Insufficient documentation

## 2017-10-09 DIAGNOSIS — F17290 Nicotine dependence, other tobacco product, uncomplicated: Secondary | ICD-10-CM | POA: Insufficient documentation

## 2017-10-09 DIAGNOSIS — F1120 Opioid dependence, uncomplicated: Secondary | ICD-10-CM | POA: Insufficient documentation

## 2017-10-09 LAB — CBC
HCT: 47.6 % (ref 40.0–52.0)
HEMOGLOBIN: 16.1 g/dL (ref 13.0–18.0)
MCH: 29.3 pg (ref 26.0–34.0)
MCHC: 33.7 g/dL (ref 32.0–36.0)
MCV: 86.8 fL (ref 80.0–100.0)
Platelets: 265 10*3/uL (ref 150–440)
RBC: 5.49 MIL/uL (ref 4.40–5.90)
RDW: 14.2 % (ref 11.5–14.5)
WBC: 5.9 10*3/uL (ref 3.8–10.6)

## 2017-10-09 LAB — URINE DRUG SCREEN, QUALITATIVE (ARMC ONLY)
Amphetamines, Ur Screen: POSITIVE — AB
Barbiturates, Ur Screen: NOT DETECTED
Benzodiazepine, Ur Scrn: NOT DETECTED
CANNABINOID 50 NG, UR ~~LOC~~: NOT DETECTED
COCAINE METABOLITE, UR ~~LOC~~: NOT DETECTED
MDMA (Ecstasy)Ur Screen: NOT DETECTED
Methadone Scn, Ur: NOT DETECTED
OPIATE, UR SCREEN: NOT DETECTED
PHENCYCLIDINE (PCP) UR S: NOT DETECTED
TRICYCLIC, UR SCREEN: NOT DETECTED

## 2017-10-09 LAB — URINALYSIS, COMPLETE (UACMP) WITH MICROSCOPIC
BACTERIA UA: NONE SEEN
Bilirubin Urine: NEGATIVE
Glucose, UA: NEGATIVE mg/dL
HGB URINE DIPSTICK: NEGATIVE
Ketones, ur: NEGATIVE mg/dL
Nitrite: NEGATIVE
PROTEIN: NEGATIVE mg/dL
SPECIFIC GRAVITY, URINE: 1.023 (ref 1.005–1.030)
pH: 5 (ref 5.0–8.0)

## 2017-10-09 LAB — COMPREHENSIVE METABOLIC PANEL
ALK PHOS: 69 U/L (ref 38–126)
ALT: 14 U/L — AB (ref 17–63)
AST: 20 U/L (ref 15–41)
Albumin: 4 g/dL (ref 3.5–5.0)
Anion gap: 11 (ref 5–15)
BUN: 17 mg/dL (ref 6–20)
CALCIUM: 9 mg/dL (ref 8.9–10.3)
CHLORIDE: 105 mmol/L (ref 101–111)
CO2: 25 mmol/L (ref 22–32)
CREATININE: 0.9 mg/dL (ref 0.61–1.24)
GFR calc non Af Amer: >60 mL/min (ref >60)
GLUCOSE: 77 mg/dL (ref 65–99)
Potassium: 3.7 mmol/L (ref 3.5–5.1)
SODIUM: 141 mmol/L (ref 135–145)
Total Bilirubin: 0.4 mg/dL (ref 0.3–1.2)
Total Protein: 7.5 g/dL (ref 6.5–8.1)

## 2017-10-09 LAB — ETHANOL: Alcohol, Ethyl (B): <10 mg/dL (ref <10)

## 2017-10-09 NOTE — ED Provider Notes (Signed)
[facility] Emergency Department Provider Note  ____________________________________________   First MD Initiated Contact with Patient 10/09/17 0214     (approximate)  I have reviewed the triage vital signs and the nursing notes.   HISTORY  Chief Complaint Psychiatric Evaluation   HPI Isaiah RutterGary W Peschke Jr. is a 36 y.o. male who comes to the emergency department on an involuntary commitment for heroin abuse.  Apparently the patient's daughter placed him on involuntary commitment because she has been concerned about his mental status.  The patient reports a frequent past medical history of intravenous heroin and methamphetamine abuse most recently used 3 days ago.  3 days ago his wife died of endocarditis.  He has been sober ever since.  Ever since the event apparently he has been expressing some depression and possible suicidal ideation at home although the patient himself denies.  His depression is been gradual onset and constant.  It was worsened by the loss of his level 1 and somewhat improved with rest.  Past Medical History:  Diagnosis Date  . H/o Lyme disease   . History of TIAs   . Hypertension     Patient Active Problem List   Diagnosis Date Noted  . 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 Opiate abuse, episodic (HCC) 03/18/2017  . Substance induced mood disorder (HCC) 03/18/2017  . Self-inflicted laceration of wrist 03/18/2017    No past surgical history on file.  Prior to Admission medications   Medication Sig Start Date End Date Taking? Authorizing Provider  doxycycline (VIBRA-TABS) 100 MG tablet Take 1 tablet (100 mg total) by mouth 2 (two) times daily. 03/17/17   Minna AntisPaduchowski, Kevin, MD  naproxen (NAPROSYN) 500 MG tablet Take 1 tablet (500 mg total) by mouth 2 (two) times daily with a meal. Patient not taking: Reported on 03/18/2017 06/24/16   Kem Boroughsriplett, Cari B, FNP  tiZANidine (ZANAFLEX) 4 MG tablet Take 1 tablet (4 mg total) by mouth 3 (three)  times daily. Patient not taking: Reported on 03/18/2017 06/24/16   Chinita Pesterriplett, Cari B, FNP  traMADol (ULTRAM) 50 MG tablet Take 1 tablet (50 mg total) by mouth every 6 (six) hours as needed. 06/18/17 06/18/18  Willy Eddyobinson, Patrick, MD    Allergies Aspirin; Penicillins; and Tegretol [carbamazepine]  No family history on file.  Social History Social History   Tobacco Use  . Smoking status: Current Every Day Smoker    Packs/day: 1.00    Types: Cigars  . Smokeless tobacco: Never Used  Substance Use Topics  . Alcohol use: No  . Drug use: Yes    Types: IV    Comment: .5 bag heroine a day    Review of Systems Constitutional: No fever/chills Eyes: No visual changes. ENT: No sore throat. Cardiovascular: Denies chest pain. Respiratory: Denies shortness of breath. Gastrointestinal: No abdominal pain.  No nausea, no vomiting.  No diarrhea.  No constipation. Genitourinary: Negative for dysuria. Musculoskeletal: Negative for back pain. Skin: Negative for rash. Neurological: Negative for headaches, focal weakness or numbness.   ____________________________________________   PHYSICAL EXAM:  VITAL SIGNS: ED Triage Vitals [10/09/17 0158]  Enc Vitals Group     BP (!) 154/98     Pulse Rate 81     Resp 20     Temp 98.3 F (36.8 C)     Temp Source Oral     SpO2 100 %     Weight 195 lb (88.5 kg)     Height 6\' 2"  (1.88 m)  Head Circumference      Peak Flow      Pain Score      Pain Loc      Pain Edu?      Excl. in GC?     Constitutional: Alert and oriented x4 flat affect nontoxic no diaphoresis speaks in full clear sentences Eyes: PERRL EOMI. Head: Atraumatic. Nose: No congestion/rhinnorhea. Mouth/Throat: No trismus Neck: No stridor.   Cardiovascular: Normal rate, regular rhythm. Grossly normal heart sounds.  Good peripheral circulation. Respiratory: Normal respiratory effort.  No retractions. Lungs CTAB and moving good air Gastrointestinal: Soft nontender Musculoskeletal:  No lower extremity edema   Neurologic:  Normal speech and language. No gross focal neurologic deficits are appreciated. Skin: Track marks across both arms Psychiatric: Somewhat flat and sad affect    ____________________________________________   DIFFERENTIAL includes but not limited to  Suicidal ideation, suicide attempt, drug overdose, alcohol intoxication, metabolic derangement ____________________________________________   LABS (all labs ordered are listed, but only abnormal results are displayed)  Labs Reviewed  COMPREHENSIVE METABOLIC PANEL - Abnormal; Notable for the following components:      Result Value   ALT 14 (*)    All other components within normal limits  URINE DRUG SCREEN, QUALITATIVE ([facility] ONLY) - Abnormal; Notable for the following components:   Amphetamines, Ur Screen POSITIVE (*)    All other components within normal limits  URINALYSIS, COMPLETE (UACMP) WITH MICROSCOPIC - Abnormal; Notable for the following components:   Color, Urine YELLOW (*)    APPearance HAZY (*)    Leukocytes, UA SMALL (*)    Squamous Epithelial / LPF 0-5 (*)    All other components within normal limits  CBC  ETHANOL    Lab work reviewed by me positive for amphetamines otherwise unremarkable __________________________________________  EKG   ____________________________________________  RADIOLOGY   ____________________________________________   PROCEDURES  Procedure(s) performed: no  Procedures  Critical Care performed: no  Observation: no ____________________________________________   INITIAL IMPRESSION / ASSESSMENT AND PLAN / ED COURSE  Pertinent labs & imaging results that were available during my care of the patient were reviewed by me and considered in my medical decision making (see chart for details).  On arrival the patient has a sad affect but is clearly alert and oriented x4 and to me denies all suicidal ideation.  I am not upholding his involuntary  commitment but the patient does agree to stay to speak to telemetry psychiatrist.     ----------------------------------------- 4:24 AM on 10/09/2017 -----------------------------------------  Specialist on-call Dr. Jonelle SidleBreuer agrees that the patient does not meet criteria for involuntary commitment.  He is discharged home with outpatient management. ____________________________________________   FINAL CLINICAL IMPRESSION(S) / ED DIAGNOSES  Final diagnoses:  Adjustment disorder with depressed mood  Heroin addiction (HCC)      NEW MEDICATIONS STARTED DURING THIS VISIT:  This SmartLink is deprecated. Use AVSMEDLIST instead to display the medication list for a patient.   Note:  This document was prepared using Dragon voice recognition software and may include unintentional dictation errors.     Merrily Brittleifenbark, Alek Poncedeleon, MD 10/09/17 2245

## 2017-10-09 NOTE — ED Triage Notes (Signed)
Pt brought in by [place] [employer] under IVC pt states he is not sure why he is here. Pt denies any SI or HI at this time is calm and cooperative.

## 2017-10-09 NOTE — ED Notes (Signed)
RN contacted by Dr. Jonelle SidleBreuer to inform that he intends to reverse pt IVC documents. Dr. Jonelle SidleBreuer states he feels there are no acute safety concerns for the pt.

## 2017-10-09 NOTE — ED Notes (Signed)
Dr. Rayburn Feltuvia Breuer contacted RN for pt report. [facility]. SOC currently being conducted.

## 2017-10-09 NOTE — ED Notes (Signed)
Pt placing cloths over burgundy scrubs. Pt states "im going to keep these for insulation"

## 2017-10-09 NOTE — ED Notes (Signed)
Sandwich tray given to pt.

## 2017-10-09 NOTE — Discharge Instructions (Signed)
PLEASE STOP USING HEROIN AND METHAMPHETAMINE.  Establish care with psychiatry within the next day or 2 for reevaluation.  Return to the emergency department sooner for any concerns whatsoever.  It was a pleasure to take care of you today, and thank you for coming to our emergency department.  If you have any questions or concerns before leaving please ask the nurse to grab me and I'm more than happy to go through your aftercare instructions again.  If you were prescribed any opioid pain medication today such as Norco, Vicodin, Percocet, morphine, hydrocodone, or oxycodone please make sure you do not drive when you are taking this medication as it can alter your ability to drive safely.  If you have any concerns once you are home that you are not improving or are in fact getting worse before you can make it to your follow-up appointment, please do not hesitate to call 911 and come back for further evaluation.  Merrily BrittleNeil Pacey Willadsen, MD  Results for orders placed or performed during the hospital encounter of 10/09/17  CBC  Result Value Ref Range   WBC 5.9 3.8 - 10.6 K/uL   RBC 5.49 4.40 - 5.90 MIL/uL   Hemoglobin 16.1 13.0 - 18.0 g/dL   HCT 19.147.6 47.840.0 - 29.552.0 %   MCV 86.8 80.0 - 100.0 fL   MCH 29.3 26.0 - 34.0 pg   MCHC 33.7 32.0 - 36.0 g/dL   RDW 62.114.2 30.811.5 - 65.714.5 %   Platelets 265 150 - 440 K/uL  Comprehensive metabolic panel  Result Value Ref Range   Sodium 141 135 - 145 mmol/L   Potassium 3.7 3.5 - 5.1 mmol/L   Chloride 105 101 - 111 mmol/L   CO2 25 22 - 32 mmol/L   Glucose, Bld 77 65 - 99 mg/dL   BUN 17 6 - 20 mg/dL   Creatinine, Ser 8.460.90 0.61 - 1.24 mg/dL   Calcium 9.0 8.9 - 96.210.3 mg/dL   Total Protein 7.5 6.5 - 8.1 g/dL   Albumin 4.0 3.5 - 5.0 g/dL   AST 20 15 - 41 U/L   ALT 14 (L) 17 - 63 U/L   Alkaline Phosphatase 69 38 - 126 U/L   Total Bilirubin 0.4 0.3 - 1.2 mg/dL   GFR calc non Af Amer >60 >60 mL/min   GFR calc Af Amer >60 >60 mL/min   Anion gap 11 5 - 15  Ethanol  Result  Value Ref Range   Alcohol, Ethyl (B) <10 <10 mg/dL  Urine Drug Screen, Qualitative ([facility] only)  Result Value Ref Range   Tricyclic, Ur Screen NONE DETECTED NONE DETECTED   Amphetamines, Ur Screen POSITIVE (A) NONE DETECTED   MDMA (Ecstasy)Ur Screen NONE DETECTED NONE DETECTED   Cocaine Metabolite,Ur [place] NONE DETECTED NONE DETECTED   Opiate, Ur Screen NONE DETECTED NONE DETECTED   Phencyclidine (PCP) Ur S NONE DETECTED NONE DETECTED   Cannabinoid 50 Ng, Ur [place] NONE DETECTED NONE DETECTED   Barbiturates, Ur Screen NONE DETECTED NONE DETECTED   Benzodiazepine, Ur Scrn NONE DETECTED NONE DETECTED   Methadone Scn, Ur NONE DETECTED NONE DETECTED  Urinalysis, Complete w Microscopic  Result Value Ref Range   Color, Urine YELLOW (A) YELLOW   APPearance HAZY (A) CLEAR   Specific Gravity, Urine 1.023 1.005 - 1.030   pH 5.0 5.0 - 8.0   Glucose, UA NEGATIVE NEGATIVE mg/dL   Hgb urine dipstick NEGATIVE NEGATIVE   Bilirubin Urine NEGATIVE NEGATIVE   Ketones, ur NEGATIVE NEGATIVE mg/dL  Protein, ur NEGATIVE NEGATIVE mg/dL   Nitrite NEGATIVE NEGATIVE   Leukocytes, UA SMALL (A) NEGATIVE   RBC / HPF 0-5 0 - 5 RBC/hpf   WBC, UA 6-30 0 - 5 WBC/hpf   Bacteria, UA NONE SEEN NONE SEEN   Squamous Epithelial / LPF 0-5 (A) NONE SEEN   Mucus PRESENT

## 2017-11-04 DIAGNOSIS — R6889 Other general symptoms and signs: Secondary | ICD-10-CM | POA: Insufficient documentation

## 2017-11-04 DIAGNOSIS — I10 Essential (primary) hypertension: Secondary | ICD-10-CM | POA: Insufficient documentation

## 2017-11-04 DIAGNOSIS — F17290 Nicotine dependence, other tobacco product, uncomplicated: Secondary | ICD-10-CM | POA: Insufficient documentation

## 2017-11-04 DIAGNOSIS — B349 Viral infection, unspecified: Secondary | ICD-10-CM | POA: Insufficient documentation

## 2017-11-04 DIAGNOSIS — R5383 Other fatigue: Secondary | ICD-10-CM | POA: Insufficient documentation

## 2017-11-05 ENCOUNTER — Emergency Department: Payer: Self-pay

## 2017-11-05 ENCOUNTER — Emergency Department
Admission: EM | Admit: 2017-11-05 | Discharge: 2017-11-05 | Disposition: A | Discharge status: Home / Self Care | Payer: Self-pay | Attending: Emergency Medicine | Admitting: Emergency Medicine

## 2017-11-05 ENCOUNTER — Encounter: Payer: Self-pay | Admitting: *Deleted

## 2017-11-05 ENCOUNTER — Other Ambulatory Visit: Payer: Self-pay

## 2017-11-05 DIAGNOSIS — B349 Viral infection, unspecified: Secondary | ICD-10-CM

## 2017-11-05 DIAGNOSIS — R6889 Other general symptoms and signs: Secondary | ICD-10-CM

## 2017-11-05 HISTORY — DX: Opioid abuse, uncomplicated: F11.10

## 2017-11-05 LAB — BASIC METABOLIC PANEL
ANION GAP: 8 (ref 5–15)
BUN: 22 mg/dL — ABNORMAL HIGH (ref 6–20)
CHLORIDE: 102 mmol/L (ref 101–111)
CO2: 28 mmol/L (ref 22–32)
Calcium: 8.8 mg/dL — ABNORMAL LOW (ref 8.9–10.3)
Creatinine, Ser: 0.87 mg/dL (ref 0.61–1.24)
GFR calc non Af Amer: >60 mL/min (ref >60)
Glucose, Bld: 131 mg/dL — ABNORMAL HIGH (ref 65–99)
Potassium: 4.4 mmol/L (ref 3.5–5.1)
Sodium: 138 mmol/L (ref 135–145)

## 2017-11-05 LAB — URINALYSIS, COMPLETE (UACMP) WITH MICROSCOPIC
Bacteria, UA: NONE SEEN
Bilirubin Urine: NEGATIVE
GLUCOSE, UA: NEGATIVE mg/dL
HGB URINE DIPSTICK: NEGATIVE
KETONES UR: 5 mg/dL — AB
NITRITE: NEGATIVE
PH: 5 (ref 5.0–8.0)
Protein, ur: NEGATIVE mg/dL
Specific Gravity, Urine: 1.028 (ref 1.005–1.030)

## 2017-11-05 LAB — CBC
HEMATOCRIT: 50.3 % (ref 40.0–52.0)
HEMOGLOBIN: 16.9 g/dL (ref 13.0–18.0)
MCH: 29.7 pg (ref 26.0–34.0)
MCHC: 33.5 g/dL (ref 32.0–36.0)
MCV: 88.5 fL (ref 80.0–100.0)
Platelets: 248 10*3/uL (ref 150–440)
RBC: 5.68 MIL/uL (ref 4.40–5.90)
RDW: 14.3 % (ref 11.5–14.5)
WBC: 8.7 10*3/uL (ref 3.8–10.6)

## 2017-11-05 LAB — TROPONIN I: Troponin I: <0.03 ng/mL (ref <0.03)

## 2017-11-05 NOTE — ED Notes (Addendum)
Pt states he has been in bed for past two day sick, sweating,and states that his GF died Dec 9th from using IV substances (Heroin and methamphetamines). Pt reports SOB, chest pain, and pt reports that he is an IV drug abuser. Family at bedside.

## 2017-11-05 NOTE — ED Provider Notes (Signed)
[facility] Emergency Department Provider Note  ____________________________________________   First MD Initiated Contact with Patient 11/05/17 0405     (approximate)  I have reviewed the triage vital signs and the nursing notes.   HISTORY  Chief Complaint Influenza and Chest Pain    HPI Isaiah Green. is a 37 y.o. male whose medical history includes frequent IV heroin use who presents for evaluation of a variety of symptoms over the last couple of weeks but more acute over the last 2 days.  Specifically over the last couple of days the symptoms include general malaise, fatigue, myalgias, subjective fever and chills, productive cough that causes chest pain, and decreased appetite.  Nothing makes his symptoms better and he describes them as severe.  He has no difficulty breathing, no dysuria, and no abdominal pain.  He is not been vomiting or having diarrhea.  He is concerned because he is a heroin abuser and he shared needles with his girlfriend who recently passed away and reportedly she had "an infection in her blood".  He knew this but used her needles anyway.    Past Medical History:  Diagnosis Date  . H/o Lyme disease   . Heroin abuse (HCC)   . History of TIAs   . Hypertension     Patient Active Problem List   Diagnosis Date Noted  . 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 Opiate abuse, episodic (HCC) 03/18/2017  . Substance induced mood disorder (HCC) 03/18/2017  . Self-inflicted laceration of wrist 03/18/2017    History reviewed. No pertinent surgical history.  Prior to Admission medications   Medication Sig Start Date End Date Taking? Authorizing Provider  doxycycline (VIBRA-TABS) 100 MG tablet Take 1 tablet (100 mg total) by mouth 2 (two) times daily. 03/17/17   Minna Antis, MD  naproxen (NAPROSYN) 500 MG tablet Take 1 tablet (500 mg total) by mouth 2 (two) times daily with a meal. Patient not taking: Reported on 03/18/2017 06/24/16    Kem Boroughs B, FNP  tiZANidine (ZANAFLEX) 4 MG tablet Take 1 tablet (4 mg total) by mouth 3 (three) times daily. Patient not taking: Reported on 03/18/2017 06/24/16   Chinita Pester, FNP  traMADol (ULTRAM) 50 MG tablet Take 1 tablet (50 mg total) by mouth every 6 (six) hours as needed. 06/18/17 06/18/18  Willy Eddy, MD    Allergies Aspirin; Penicillins; and Tegretol [carbamazepine]  History reviewed. No pertinent family history.  Social History Social History   Tobacco Use  . Smoking status: Current Every Day Smoker    Packs/day: 1.00    Types: Cigars  . Smokeless tobacco: Never Used  Substance Use Topics  . Alcohol use: No  . Drug use: Yes    Types: IV    Comment: .5 bag heroine a day    Review of Systems Constitutional: +fever/chills, myalgias, fatigue Eyes: No visual changes nor eye pain ENT: No sore throat nor difficulty swallowing Cardiovascular: Chest pain with cough Respiratory: Denies shortness of breath.  Productive cough Gastrointestinal: No abdominal pain.  No nausea, no vomiting.  No diarrhea.  No constipation.  Decreased appetite Genitourinary: Negative for dysuria. Musculoskeletal: Negative for neck pain.  Negative for back pain. Integumentary: Negative for rash. Neurological: Negative for headaches, focal weakness or numbness.   ____________________________________________   PHYSICAL EXAM:  VITAL SIGNS: ED Triage Vitals  Enc Vitals Group     BP 11/05/17 0010 (!) 163/103     Pulse Rate 11/05/17 0010 97  Resp 11/05/17 0010 20     Temp 11/05/17 0010 98.5 F (36.9 C)     Temp Source 11/05/17 0010 Oral     SpO2 11/05/17 0010 99 %     Weight 11/05/17 0011 83.9 kg (185 lb)     Height 11/05/17 0011 1.829 m (6')     Head Circumference --      Peak Flow --      Pain Score 11/05/17 0010 7     Pain Loc --      Pain Edu? --      Excl. in GC? --     Constitutional: Alert and oriented. Well appearing and in no acute distress. Eyes:  Conjunctivae are normal.  Head: Atraumatic. Nose: No congestion/rhinnorhea. Mouth/Throat: Mucous membranes are moist. Neck: No stridor.  No meningeal signs.   Cardiovascular: Normal rate, regular rhythm. Good peripheral circulation. Grossly normal heart sounds. Respiratory: Normal respiratory effort.  No retractions. Lungs CTAB. Gastrointestinal: Soft and nontender. No distention.  Musculoskeletal: No lower extremity tenderness nor edema. No gross deformities of extremities. Neurologic:  Normal speech and language. No gross focal neurologic deficits are appreciated.  Skin:  Skin is warm, dry and intact. No rash noted. Psychiatric: Mood and affect are normal. Speech and behavior are normal.  ____________________________________________   LABS (all labs ordered are listed, but only abnormal results are displayed)  Labs Reviewed  BASIC METABOLIC PANEL - Abnormal; Notable for the following components:      Result Value   Glucose, Bld 131 (*)    BUN 22 (*)    Calcium 8.8 (*)    All other components within normal limits  URINALYSIS, COMPLETE (UACMP) WITH MICROSCOPIC - Abnormal; Notable for the following components:   Color, Urine YELLOW (*)    APPearance HAZY (*)    Ketones, ur 5 (*)    Leukocytes, UA SMALL (*)    Squamous Epithelial / LPF 0-5 (*)    All other components within normal limits  CULTURE, BLOOD (ROUTINE X 2)  CULTURE, BLOOD (ROUTINE X 2)  URINE CULTURE  CBC  TROPONIN I   ____________________________________________  EKG  ED ECG REPORT I, Loleta Roseory Brettany Sydney, the attending physician, personally viewed and interpreted this ECG.  Date: 11/05/2017 EKG Time: 00:19 Rate: 89 Rhythm: normal sinus rhythm QRS Axis: normal Intervals: normal ST/T Wave abnormalities: normal Narrative Interpretation: no evidence of acute ischemia  ____________________________________________  RADIOLOGY   Dg Chest 2 View  Result Date: 11/05/2017 CLINICAL DATA:  Flu like symptoms x2 days  EXAM: CHEST  2 VIEW COMPARISON:  None. FINDINGS: The heart size and mediastinal contours are within normal limits. No pneumonic consolidation or CHF. No effusion or pneumothorax. The visualized skeletal structures are unremarkable. IMPRESSION: No active cardiopulmonary disease. Electronically Signed   By: Tollie Ethavid  Kwon M.D.   On: 11/05/2017 03:49    ____________________________________________   PROCEDURES  Critical Care performed: No   Procedure(s) performed:   Procedures   ____________________________________________   INITIAL IMPRESSION / ASSESSMENT AND PLAN / ED COURSE  As part of my medical decision making, I reviewed the following data within the electronic MEDICAL RECORD NUMBER Nursing notes reviewed and incorporated, Labs reviewed , EKG interpreted , Radiograph reviewed  and Notes from prior ED visits    Differential diagnosis includes, but is not limited to, acute viral illness including influenza, endocarditis/myocarditis, bacteremia, pneumonia, etc.  Fortunately the patient's workup is very reassuring.  He has normal vital signs and all of his lab work was within normal  limits including no leukocytosis.  His oxygenation is in the upper 90s and he is afebrile.  I explained to him that we could check for influenza but there is little reason to do so as he has had symptoms for several days and Tamiflu does not work well anyway.  He agrees to no testing at this point since it would not change management.  Given his concerns for the possibility of bacteremia, I will send blood cultures prior to his discharge and explained he will be contacted if the results are positive, but at this time there is no evidence of any acute or emergent medical condition and he will be discharged for outpatient follow-up.  He understands and agrees with the plan.  His family is present in the room during the discussion with his permission and they also agree with the plan.      ____________________________________________  FINAL CLINICAL IMPRESSION(S) / ED DIAGNOSES  Final diagnoses:  Flu-like symptoms  Viral syndrome     MEDICATIONS GIVEN DURING THIS VISIT:  Medications - No data to display   ED Discharge Orders    None       Note:  This document was prepared using Dragon voice recognition software and may include unintentional dictation errors.    Loleta Rose, MD 11/05/17 [phone]

## 2017-11-05 NOTE — Discharge Instructions (Signed)
You have been seen in the Emergency Department (ED) today for a likely viral illness.  Please drink plenty of clear fluids (water, Gatorade, chicken broth, etc).  You may use Tylenol and/or Motrin according to label instructions.  You can alternate between the two without any side effects.   Just in case you have an infection in your blood, we sent blood cultures to the lab.  If they come back positive (meaning that you do have a blood infection), you will be contacted by a nurse at the phone number you provided.  Call your doctor or return to the Emergency Department (ED) if you are unable to tolerate fluids due to vomiting, have worsening trouble breathing, become extremely tired or difficult to awaken, or if you develop any other symptoms that concern you.

## 2017-11-05 NOTE — ED Triage Notes (Addendum)
Pt reports he has flu like sx for 2 days.  Pt reports chills, sweats, cough.  States I just don't feel well.  No n/v/d.  Pt alert.  Pt is iv drug user.  Last used heroin yesterday.  Pt reports hurting in his chest. Pt states girlfriend died recently and was using his iv drug paraphernalia.  Pt concerned about infection

## 2017-11-06 LAB — URINE CULTURE: Special Requests: NORMAL

## 2017-11-10 LAB — CULTURE, BLOOD (ROUTINE X 2)
CULTURE: NO GROWTH
CULTURE: NO GROWTH
SPECIAL REQUESTS: ADEQUATE
Special Requests: ADEQUATE

## 2017-12-06 ENCOUNTER — Other Ambulatory Visit: Payer: Self-pay

## 2017-12-06 ENCOUNTER — Encounter: Payer: Self-pay | Admitting: Emergency Medicine

## 2017-12-06 ENCOUNTER — Emergency Department: Payer: Self-pay

## 2017-12-06 ENCOUNTER — Inpatient Hospital Stay
Admission: EM | Admit: 2017-12-06 | Discharge: 2017-12-07 | DRG: 917 | Discharge status: Against Medical Advice | Payer: Self-pay | Attending: Internal Medicine | Admitting: Internal Medicine

## 2017-12-06 ENCOUNTER — Inpatient Hospital Stay: Payer: Self-pay

## 2017-12-06 DIAGNOSIS — Z888 Allergy status to other drugs, medicaments and biological substances status: Secondary | ICD-10-CM

## 2017-12-06 DIAGNOSIS — G92 Toxic encephalopathy: Secondary | ICD-10-CM | POA: Diagnosis present

## 2017-12-06 DIAGNOSIS — J9690 Respiratory failure, unspecified, unspecified whether with hypoxia or hypercapnia: Secondary | ICD-10-CM

## 2017-12-06 DIAGNOSIS — Y906 Blood alcohol level of 120-199 mg/100 ml: Secondary | ICD-10-CM | POA: Diagnosis present

## 2017-12-06 DIAGNOSIS — T50904A Poisoning by unspecified drugs, medicaments and biological substances, undetermined, initial encounter: Secondary | ICD-10-CM

## 2017-12-06 DIAGNOSIS — Z4659 Encounter for fitting and adjustment of other gastrointestinal appliance and device: Secondary | ICD-10-CM

## 2017-12-06 DIAGNOSIS — R402332 Coma scale, best motor response, abnormal, at arrival to emergency department: Secondary | ICD-10-CM | POA: Diagnosis present

## 2017-12-06 DIAGNOSIS — Z880 Allergy status to penicillin: Secondary | ICD-10-CM

## 2017-12-06 DIAGNOSIS — Z886 Allergy status to analgesic agent status: Secondary | ICD-10-CM

## 2017-12-06 DIAGNOSIS — T50901A Poisoning by unspecified drugs, medicaments and biological substances, accidental (unintentional), initial encounter: Secondary | ICD-10-CM | POA: Diagnosis present

## 2017-12-06 DIAGNOSIS — I10 Essential (primary) hypertension: Secondary | ICD-10-CM | POA: Diagnosis present

## 2017-12-06 DIAGNOSIS — F10129 Alcohol abuse with intoxication, unspecified: Secondary | ICD-10-CM | POA: Diagnosis present

## 2017-12-06 DIAGNOSIS — R402212 Coma scale, best verbal response, none, at arrival to emergency department: Secondary | ICD-10-CM | POA: Diagnosis present

## 2017-12-06 DIAGNOSIS — Z8673 Personal history of transient ischemic attack (TIA), and cerebral infarction without residual deficits: Secondary | ICD-10-CM

## 2017-12-06 DIAGNOSIS — J9601 Acute respiratory failure with hypoxia: Secondary | ICD-10-CM | POA: Diagnosis present

## 2017-12-06 DIAGNOSIS — T43621A Poisoning by amphetamines, accidental (unintentional), initial encounter: Principal | ICD-10-CM | POA: Diagnosis present

## 2017-12-06 DIAGNOSIS — F17290 Nicotine dependence, other tobacco product, uncomplicated: Secondary | ICD-10-CM | POA: Diagnosis present

## 2017-12-06 DIAGNOSIS — R402122 Coma scale, eyes open, to pain, at arrival to emergency department: Secondary | ICD-10-CM | POA: Diagnosis present

## 2017-12-06 LAB — ACETAMINOPHEN LEVEL

## 2017-12-06 LAB — COMPREHENSIVE METABOLIC PANEL
ALK PHOS: 49 U/L (ref 38–126)
ALT: 31 U/L (ref 17–63)
ANION GAP: 17 — AB (ref 5–15)
AST: 33 U/L (ref 15–41)
Albumin: 4.4 g/dL (ref 3.5–5.0)
BILIRUBIN TOTAL: 0.9 mg/dL (ref 0.3–1.2)
BUN: 16 mg/dL (ref 6–20)
CALCIUM: 8.9 mg/dL (ref 8.9–10.3)
CO2: 22 mmol/L (ref 22–32)
CREATININE: 0.96 mg/dL (ref 0.61–1.24)
Chloride: 101 mmol/L (ref 101–111)
GFR calc Af Amer: >60 mL/min (ref >60)
GFR calc non Af Amer: >60 mL/min (ref >60)
Glucose, Bld: 109 mg/dL — ABNORMAL HIGH (ref 65–99)
Potassium: 3.7 mmol/L (ref 3.5–5.1)
Sodium: 140 mmol/L (ref 135–145)
TOTAL PROTEIN: 7.5 g/dL (ref 6.5–8.1)

## 2017-12-06 LAB — CBC
HEMATOCRIT: 47.2 % (ref 40.0–52.0)
HEMOGLOBIN: 15.8 g/dL (ref 13.0–18.0)
MCH: 29 pg (ref 26.0–34.0)
MCHC: 33.4 g/dL (ref 32.0–36.0)
MCV: 86.8 fL (ref 80.0–100.0)
Platelets: 187 10*3/uL (ref 150–440)
RBC: 5.44 MIL/uL (ref 4.40–5.90)
RDW: 14.5 % (ref 11.5–14.5)
WBC: 9.2 10*3/uL (ref 3.8–10.6)

## 2017-12-06 LAB — BLOOD GAS, ARTERIAL
Acid-base deficit: 0.8 mmol/L (ref 0.0–2.0)
Bicarbonate: 23.5 mmol/L (ref 20.0–28.0)
FIO2: 0.5
LHR: 18 {breaths}/min
MECHVT: 500 mL
O2 SAT: 99.5 %
PATIENT TEMPERATURE: 37
PCO2 ART: 37 mmHg (ref 32.0–48.0)
PEEP/CPAP: 5 cmH2O
PO2 ART: 164 mmHg — AB (ref 83.0–108.0)
pH, Arterial: 7.41 (ref 7.350–7.450)

## 2017-12-06 LAB — LACTIC ACID, PLASMA
Lactic Acid, Venous: 2.6 mmol/L (ref 0.5–1.9)
Lactic Acid, Venous: 3.6 mmol/L (ref 0.5–1.9)

## 2017-12-06 LAB — URINE DRUG SCREEN, QUALITATIVE (ARMC ONLY)
Amphetamines, Ur Screen: POSITIVE — AB
Barbiturates, Ur Screen: NOT DETECTED
Benzodiazepine, Ur Scrn: NOT DETECTED
COCAINE METABOLITE, UR ~~LOC~~: NOT DETECTED
Cannabinoid 50 Ng, Ur ~~LOC~~: NOT DETECTED
MDMA (Ecstasy)Ur Screen: NOT DETECTED
METHADONE SCREEN, URINE: NOT DETECTED
OPIATE, UR SCREEN: NOT DETECTED
PHENCYCLIDINE (PCP) UR S: NOT DETECTED
Tricyclic, Ur Screen: NOT DETECTED

## 2017-12-06 LAB — ETHANOL: Alcohol, Ethyl (B): 169 mg/dL — ABNORMAL HIGH (ref <10)

## 2017-12-06 LAB — SALICYLATE LEVEL: Salicylate Lvl: <7 mg/dL (ref 2.8–30.0)

## 2017-12-06 LAB — TROPONIN I

## 2017-12-06 MED ORDER — ORAL CARE MOUTH RINSE: 15.0000 mL | Freq: Four times a day (QID) | OROMUCOSAL | Status: DC

## 2017-12-06 MED ORDER — ETOMIDATE 2 MG/ML IV SOLN
INTRAVENOUS | Status: AC | PRN
  Administered 2017-12-06: 25 mg via INTRAVENOUS

## 2017-12-06 MED ORDER — PANTOPRAZOLE SODIUM 40 MG IV SOLR
40.0000 mg | Freq: Every day | INTRAVENOUS | Status: DC
  Administered 2017-12-07: 40 mg via INTRAVENOUS
  Filled 2017-12-06: qty 40

## 2017-12-06 MED ORDER — SODIUM CHLORIDE 0.9 % IV BOLUS (SEPSIS)
1000.0000 mL | Freq: Once | INTRAVENOUS | Status: AC
  Administered 2017-12-06: 1000 mL via INTRAVENOUS

## 2017-12-06 MED ORDER — CLINDAMYCIN PHOSPHATE 600 MG/50ML IV SOLN
INTRAVENOUS | Status: AC
  Administered 2017-12-06: 600 mg via INTRAVENOUS
  Filled 2017-12-06: qty 50

## 2017-12-06 MED ORDER — ALBUTEROL SULFATE (2.5 MG/3ML) 0.083% IN NEBU: 2.5000 mg | INHALATION_SOLUTION | RESPIRATORY_TRACT | Status: DC | PRN

## 2017-12-06 MED ORDER — BISACODYL 10 MG RE SUPP: 10.0000 mg | Freq: Every day | RECTAL | Status: DC | PRN

## 2017-12-06 MED ORDER — SODIUM CHLORIDE 0.9 % IV SOLN
INTRAVENOUS | Status: DC
  Administered 2017-12-06: 23:00:00 via INTRAVENOUS

## 2017-12-06 MED ORDER — MIDAZOLAM HCL 2 MG/2ML IJ SOLN
2.0000 mg | INTRAMUSCULAR | Status: AC | PRN
  Administered 2017-12-07 (×3): 2 mg via INTRAVENOUS
  Filled 2017-12-06 (×3): qty 2

## 2017-12-06 MED ORDER — PROPOFOL 1000 MG/100ML IV EMUL
5.0000 ug/kg/min | INTRAVENOUS | Status: DC
  Administered 2017-12-06: 70 ug/kg/min via INTRAVENOUS
  Administered 2017-12-07: 80 ug/kg/min via INTRAVENOUS
  Administered 2017-12-07: 70 ug/kg/min via INTRAVENOUS
  Administered 2017-12-07: 80 ug/kg/min via INTRAVENOUS
  Filled 2017-12-06 (×5): qty 100

## 2017-12-06 MED ORDER — CHLORHEXIDINE GLUCONATE 0.12% ORAL RINSE (MEDLINE KIT)
15.0000 mL | Freq: Two times a day (BID) | OROMUCOSAL | Status: DC
  Administered 2017-12-07 (×2): 15 mL via OROMUCOSAL

## 2017-12-06 MED ORDER — PROPOFOL 1000 MG/100ML IV EMUL
INTRAVENOUS | Status: AC
  Administered 2017-12-06: 60 ug/kg/min via INTRAVENOUS
  Filled 2017-12-06: qty 100

## 2017-12-06 MED ORDER — PROPOFOL 1000 MG/100ML IV EMUL
5.0000 ug/kg/min | Freq: Once | INTRAVENOUS | Status: DC
  Administered 2017-12-06: 60 ug/kg/min via INTRAVENOUS

## 2017-12-06 MED ORDER — CLINDAMYCIN PHOSPHATE 600 MG/50ML IV SOLN
600.0000 mg | Freq: Three times a day (TID) | INTRAVENOUS | Status: DC
  Administered 2017-12-06 – 2017-12-07 (×3): 600 mg via INTRAVENOUS
  Filled 2017-12-06 (×5): qty 50

## 2017-12-06 MED ORDER — MIDAZOLAM HCL 2 MG/2ML IJ SOLN
2.0000 mg | INTRAMUSCULAR | Status: DC | PRN
  Administered 2017-12-07: 2 mg via INTRAVENOUS
  Filled 2017-12-06 (×2): qty 2

## 2017-12-06 MED ORDER — ROCURONIUM BROMIDE 50 MG/5ML IV SOLN
INTRAVENOUS | Status: AC | PRN
  Administered 2017-12-06: 70 mg via INTRAVENOUS

## 2017-12-06 MED ORDER — ACETAMINOPHEN 650 MG RE SUPP: 650.0000 mg | Freq: Four times a day (QID) | RECTAL | Status: DC | PRN

## 2017-12-06 MED ORDER — ONDANSETRON HCL 4 MG/2ML IJ SOLN: 4.0000 mg | Freq: Four times a day (QID) | INTRAMUSCULAR | Status: DC | PRN

## 2017-12-06 MED ORDER — PROPOFOL BOLUS VIA INFUSION
60.0000 mg | Freq: Once | INTRAVENOUS | Status: AC
  Administered 2017-12-06: 60 mg via INTRAVENOUS

## 2017-12-06 MED ORDER — SENNOSIDES 8.8 MG/5ML PO SYRP
5.0000 mL | ORAL_SOLUTION | Freq: Two times a day (BID) | ORAL | Status: DC | PRN
  Filled 2017-12-06: qty 5

## 2017-12-06 MED ORDER — ONDANSETRON HCL 4 MG PO TABS: 4.0000 mg | ORAL_TABLET | Freq: Four times a day (QID) | ORAL | Status: DC | PRN

## 2017-12-06 MED ORDER — PROPOFOL 1000 MG/100ML IV EMUL
INTRAVENOUS | Status: AC
  Filled 2017-12-06: qty 100

## 2017-12-06 MED ORDER — ENOXAPARIN SODIUM 40 MG/0.4ML ~~LOC~~ SOLN: 40.0000 mg | SUBCUTANEOUS | Status: DC

## 2017-12-06 MED ORDER — PROPOFOL 1000 MG/100ML IV EMUL
INTRAVENOUS | Status: AC
  Administered 2017-12-06: 70 ug/kg/min via INTRAVENOUS
  Filled 2017-12-06: qty 100

## 2017-12-06 MED ORDER — IPRATROPIUM-ALBUTEROL 0.5-2.5 (3) MG/3ML IN SOLN
3.0000 mL | Freq: Four times a day (QID) | RESPIRATORY_TRACT | Status: DC
  Administered 2017-12-07 (×4): 3 mL via RESPIRATORY_TRACT
  Filled 2017-12-06 (×4): qty 3

## 2017-12-06 MED ORDER — ACETAMINOPHEN 325 MG PO TABS: 650.0000 mg | ORAL_TABLET | Freq: Four times a day (QID) | ORAL | Status: DC | PRN

## 2017-12-06 MED FILL — Medication: Qty: 1 | Status: AC

## 2017-12-06 NOTE — H&P (Addendum)
[facility] - [place] [facility]   PATIENT NAME: Isaiah Green    MR#:  [id number]  DATE OF BIRTH:  1981/05/04  DATE OF ADMISSION:  12/06/2017  PRIMARY CARE PHYSICIAN: Patient, No Pcp Per   REQUESTING/REFERRING PHYSICIAN: Nita Sickle, MD  CHIEF COMPLAINT:   Chief Complaint  Patient presents with  . Drug Overdose   Drug overdose. HISTORY OF PRESENT ILLNESS:  Isaiah Green  is a 37 y.o. male with a known history of heroine abuse, hypertension TIA and Lyme's disease.  The patient was found unresponsive in his house.  He was unresponsive with agonal respirations.  The patient received multiple doses of Narcan without response.  He is intubated in the ED.  Per ED physician, the patient has had multiple illlicit drugs in his house.  He has some multiple needle injections on his arms. PAST MEDICAL HISTORY:   Past Medical History:  Diagnosis Date  . H/o Lyme disease   . Heroin abuse (HCC)   . History of TIAs   . Hypertension     PAST SURGICAL HISTORY:  History reviewed. No pertinent surgical history.  Unable to obtain.  SOCIAL HISTORY:   Social History   Tobacco Use  . Smoking status: Current Every Day Smoker    Packs/day: 1.00    Types: Cigars  . Smokeless tobacco: Never Used  Substance Use Topics  . Alcohol use: No    FAMILY HISTORY:  No family history on file.  Unable to obtain.  DRUG ALLERGIES:   Allergies  Allergen Reactions  . Aspirin   . Penicillins Swelling    Has patient had a PCN reaction causing immediate rash, facial/tongue/throat swelling, SOB or lightheadedness with hypotension: yes, throat swelling Has patient had a PCN reaction causing severe rash involving mucus membranes or skin necrosis: no Has patient had a PCN reaction that required hospitalization: no Has patient had a PCN reaction occurring within the last 10 years: no If all of the above answers are "NO", then may proceed with Cephalosporin use.   . Tegretol  [Carbamazepine]     REVIEW OF SYSTEMS:   Review of Systems  Unable to perform ROS: Intubated    MEDICATIONS AT HOME:   Prior to Admission medications   Medication Sig Start Date End Date Taking? Authorizing Provider  doxycycline (VIBRA-TABS) 100 MG tablet Take 1 tablet (100 mg total) by mouth 2 (two) times daily. Patient not taking: Reported on 12/06/2017 03/17/17   Minna Antis, MD  naproxen (NAPROSYN) 500 MG tablet Take 1 tablet (500 mg total) by mouth 2 (two) times daily with a meal. Patient not taking: Reported on 03/18/2017 06/24/16   Kem Boroughs B, FNP  tiZANidine (ZANAFLEX) 4 MG tablet Take 1 tablet (4 mg total) by mouth 3 (three) times daily. Patient not taking: Reported on 03/18/2017 06/24/16   Chinita Pester, FNP  traMADol (ULTRAM) 50 MG tablet Take 1 tablet (50 mg total) by mouth every 6 (six) hours as needed. Patient not taking: Reported on 12/06/2017 06/18/17 06/18/18  Willy Eddy, MD      VITAL SIGNS:  Blood pressure 126/89, pulse 86, temperature (!) 95.1 F (35.1 C), temperature source Rectal, resp. rate (!) 21, weight 185 lb (83.9 kg), SpO2 100 %.  PHYSICAL EXAMINATION:  Physical Exam  GENERAL:  37 y.o.-year-old patient lying in the bed, on ventilation.  EYES: Pupils equal, round, pinpoint, not reactive to light. No scleral icterus.  HEENT: Head atraumatic, normocephalic. Oropharynx and nasopharynx clear.  NECK:  Supple, no jugular venous distention. No thyroid enlargement, no tenderness.  LUNGS: Diminished breath sounds bilaterally, no wheezing, rales,rhonchi or crepitation. No use of accessory muscles of respiration.  CARDIOVASCULAR: S1, S2 normal. No murmurs, rubs, or gallops.  ABDOMEN: Soft, nontender, nondistended.  Hypoactive bowel sounds present. No organomegaly or mass.  EXTREMITIES: No pedal edema, cyanosis, or clubbing.  NEUROLOGIC: On ventilation and sedation.  Unable to exam. PSYCHIATRIC: The patient is on ventilation. SKIN: No obvious rash,  lesion, or ulcer.   LABORATORY PANEL:   CBC Recent Labs  Lab 12/06/17 1859  WBC 9.2  HGB 15.8  HCT 47.2  PLT 187   ------------------------------------------------------------------------------------------------------------------  Chemistries  Recent Labs  Lab 12/06/17 1859  NA 140  K 3.7  CL 101  CO2 22  GLUCOSE 109*  BUN 16  CREATININE 0.96  CALCIUM 8.9  AST 33  ALT 31  ALKPHOS 49  BILITOT PENDING   ------------------------------------------------------------------------------------------------------------------  Cardiac Enzymes Recent Labs  Lab 12/06/17 1859  TROPONINI <0.03   ------------------------------------------------------------------------------------------------------------------  RADIOLOGY:  Dg Chest Portable 1 View  Result Date: 12/06/2017 CLINICAL DATA:  Post intubation EXAM: PORTABLE CHEST 1 VIEW COMPARISON:  Portable exam 1922 hours compared to 11/05/2017 FINDINGS: Tip of endotracheal tube projects 6.0 cm above carina. Nasogastric tube extends into abdomen. Normal heart size, mediastinal contours, and pulmonary vascularity. Suspicion of mild LEFT infrahilar infiltrate. Remaining lungs clear. No pleural effusion or pneumothorax. IMPRESSION: Suspect mild LEFT infrahilar infiltrate. Electronically Signed   By: Ulyses SouthwardMark  Boles M.D.   On: 12/06/2017 19:42      IMPRESSION AND PLAN:   Acute respiratory failure and acute metabolic encephalopathy due to drug overdose. The patient will be admitted to ICU. Continue ventilation, sedation and intensivist consult. Drug screen show positive amphetamine. Per Elink intensivist, repeated drug screen and CAT scan of the head without contrast stat.  Possible aspiration pneumonia. Start clindamycin.  Patient has allergy to penicillin.  Hypertension.  Blood pressure is normal.  Discussed with E-link intensivist. I called his sister, Isaiah Green and discussed about the patient in critical condition. All the  records are reviewed and case discussed with ED provider. Management plans discussed with the patient, family and they are in agreement.  CODE STATUS: Full code  TOTAL CRITICAL TIME TAKING CARE OF THIS PATIENT: 56 minutes.    Shaune PollackQing Deepak Bless M.D on 12/06/2017 at 8:12 PM  Between 7am to 6pm - Pager - [phone]  After 6pm go to [email or web address] - Social research officer, governmentpassword [email or web address]  [facility] [place]  Office  [phone]  CC: Primary care physician; Patient, No Pcp Per   Note: This dictation was prepared with Dragon dictation along with smaller phrase technology. Any transcriptional errors that result from this process are unin

## 2017-12-06 NOTE — Progress Notes (Signed)
[facility] As care team worked on [facility] [facility] Ch prayed for the Pt and team quietly.    12/06/17 1900  Clinical Encounter Type  Visited With Patient;Health care provider  Visit Type Initial;Spiritual support  Referral From Nurse  Spiritual Encounters  Spiritual Needs Prayer;Emotional

## 2017-12-06 NOTE — ED Triage Notes (Signed)
Pt to ED via [facility] from home for drug overdose. Pt has known hx/o drug abuse and seizures. Pt was given 6 mg of Narcan by EMS with no response. Pt being bagged on arrival to ED.

## 2017-12-06 NOTE — Consult Note (Signed)
PULMONARY / CRITICAL CARE MEDICINE   Name: Isaiah Green. MRN: [id number] DOB: 1981/04/25    ADMISSION DATE:  12/06/2017   CONSULTATION DATE: December 06, 2017  REFERRING MD: Dr. Imogene Burnhen  Reason: Acute respiratory failure  HISTORY OF PRESENT ILLNESS: This is a 37 year old Caucasian male with a past medical history as indicated below current polysubstance abuse who was found unresponsive by family.  When EMS arrived patient was having agonal respirations.  He was given 2 mg of intranasal Narcan with no effect.  At the ED he was given an additional 4 mg of IV Narcan without improvement in his respiratory status hence he was intubated for airway protection.  Actual downtime is unknown His toxicology was positive for amphetamines and benzodiazepines.  His alcohol level was 169 and his salicylate level was negative.  His CT head was negative as well as his chest x-ray.   PAST MEDICAL HISTORY :  He  has a past medical history of H/o Lyme disease, Heroin abuse (HCC), History of TIAs, and Hypertension.  PAST SURGICAL HISTORY: He  has no past surgical history on file.  Allergies  Allergen Reactions  . Aspirin   . Penicillins Swelling    Has patient had a PCN reaction causing immediate rash, facial/tongue/throat swelling, SOB or lightheadedness with hypotension: yes, throat swelling Has patient had a PCN reaction causing severe rash involving mucus membranes or skin necrosis: no Has patient had a PCN reaction that required hospitalization: no Has patient had a PCN reaction occurring within the last 10 years: no If all of the above answers are "NO", then may proceed with Cephalosporin use.   . Tegretol [Carbamazepine]     No current facility-administered medications on file prior to encounter.    Current Outpatient Medications on File Prior to Encounter  Medication Sig  . doxycycline (VIBRA-TABS) 100 MG tablet Take 1 tablet (100 mg total) by mouth 2 (two) times daily. (Patient not taking:  Reported on 12/06/2017)  . naproxen (NAPROSYN) 500 MG tablet Take 1 tablet (500 mg total) by mouth 2 (two) times daily with a meal. (Patient not taking: Reported on 03/18/2017)  . tiZANidine (ZANAFLEX) 4 MG tablet Take 1 tablet (4 mg total) by mouth 3 (three) times daily. (Patient not taking: Reported on 03/18/2017)  . traMADol (ULTRAM) 50 MG tablet Take 1 tablet (50 mg total) by mouth every 6 (six) hours as needed. (Patient not taking: Reported on 12/06/2017)    FAMILY HISTORY:  His has no family status information on file.    SOCIAL HISTORY: He  reports that he has been smoking cigars.  He has been smoking about 1.00 pack per day. he has never used smokeless tobacco. He reports that he uses drugs. Drug: IV. He reports that he does not drink alcohol.  REVIEW OF SYSTEMS:   Unable to obtain as patient is currently intubated and sedated  SUBJECTIVE:   VITAL SIGNS: BP (!) 89/56 (BP Location: Right Arm)   Pulse 86   Temp (!) 95.5 F (35.3 C)   Resp 18   Wt 83.9 kg (185 lb)   SpO2 99%   BMI 25.09 kg/m   HEMODYNAMICS:    VENTILATOR SETTINGS: Vent Mode: AC FiO2 (%):  [50 %] (P) 50 % Set Rate:  [18 bmp] 18 bmp Vt Set:  [500 mL] 500 mL PEEP:  [5 cmH20] 5 cmH20  INTAKE / OUTPUT: No intake/output data recorded.  PHYSICAL EXAMINATION: General: Sedated Neuro: Withdraws to noxious stimulus, positive gag reflex, pupils  are reactive, corneal reflexes intact HEENT: Trachea midline, neck is supple with good range of motion, no JVD Cardiovascular: Apical pulse regular, S1-S2, no murmur regurg or gallop Lungs: To auscultation bilaterally Abdomen: Nondistended, normal bowel sounds Musculoskeletal: No deformities Skin: Warm and dry  LABS:  BMET Recent Labs  Lab 12/06/17 1859  NA 140  K 3.7  CL 101  CO2 22  BUN 16  CREATININE 0.96  GLUCOSE 109*    Electrolytes Recent Labs  Lab 12/06/17 1859  CALCIUM 8.9    CBC Recent Labs  Lab 12/06/17 1859  WBC 9.2  HGB 15.8  HCT  47.2  PLT 187    Coag's No results for input(s): APTT, INR in the last 168 hours.  Sepsis Markers Recent Labs  Lab 12/06/17 1905 12/06/17 2059  LATICACIDVEN 2.6* 3.6*    ABG Recent Labs  Lab 12/06/17 1912  PHART 7.41  PCO2ART 37  PO2ART 164*    Liver Enzymes Recent Labs  Lab 12/06/17 1859  AST 33  ALT 31  ALKPHOS 49  BILITOT 0.9  ALBUMIN 4.4    Cardiac Enzymes Recent Labs  Lab 12/06/17 1859  TROPONINI <0.03    Glucose No results for input(s): GLUCAP in the last 168 hours.  Imaging Dg Chest Portable 1 View  Result Date: 12/06/2017 CLINICAL DATA:  Nasogastric tube placement. EXAM: PORTABLE CHEST 1 VIEW COMPARISON:  Chest radiograph performed earlier today at 8:16 p.m. FINDINGS: The patient's endotracheal tube is seen ending 3 cm above the carina. The patient's enteric tube is seen ending overlying the antrum of the stomach. The lungs are clear bilaterally. No focal consolidation, pleural effusion or pneumothorax is seen. The cardiomediastinal silhouette is normal in size. No acute osseous abnormalities are identified. IMPRESSION: 1. Enteric tube noted ending overlying the antrum of the stomach. 2. Endotracheal tube seen ending 3 cm above the carina. 3. Lungs clear bilaterally. Electronically Signed   By: Roanna Raider M.D.   On: 12/06/2017 21:38   Dg Chest Portable 1 View  Result Date: 12/06/2017 CLINICAL DATA:  Orogastric tube placement.  Hypoxia. EXAM: PORTABLE CHEST 1 VIEW COMPARISON:  Study obtained earlier in the day FINDINGS: Endotracheal tube tip is 4.4 cm above the carina. Orogastric tube coils in the esophagus slightly superior to the aortic arch with the tip extending into the cervical region. Lungs are clear. Heart size and pulmonary vascularity are normal. No adenopathy. No bone lesions. IMPRESSION: Orogastric tube coils in the upper esophagus with the tip directed superiorly into the cervical region. Endotracheal tube tip 4.4 cm above the carina. No  pneumothorax. Lungs clear. These results were called by telephone at the time of interpretation on 12/06/2017 at 8:41 pm to Dr. Nita Sickle , who verbally acknowledged these results. Electronically Signed   By: Bretta Bang III M.D.   On: 12/06/2017 20:44   Dg Chest Portable 1 View  Result Date: 12/06/2017 CLINICAL DATA:  Post intubation EXAM: PORTABLE CHEST 1 VIEW COMPARISON:  Portable exam 1922 hours compared to 11/05/2017 FINDINGS: Tip of endotracheal tube projects 6.0 cm above carina. Nasogastric tube extends into abdomen. Normal heart size, mediastinal contours, and pulmonary vascularity. Suspicion of mild LEFT infrahilar infiltrate. Remaining lungs clear. No pleural effusion or pneumothorax. IMPRESSION: Suspect mild LEFT infrahilar infiltrate. Electronically Signed   By: Ulyses Southward M.D.   On: 12/06/2017 19:42   STUDIES:  None  CULTURES: None  ANTIBIOTICS: None  SIGNIFICANT EVENTS: 12/06/2017: Admitted  LINES/TUBES: Peripheral IVs  DISCUSSION: 37 year old Caucasian male with  a history of polysubstance abuse presenting with alcohol intoxication, acute respiratory failure secondary to altered mental status and possible overdose; actual substances unknown  ASSESSMENT / PLAN:  PULMONARY A: Acute respiratory failure secondary to overdose and alcohol intoxication P:   Full vent support with current settings ABG and chest x-ray post intubation reviewed VAP protocol Weaning trials as tolerated  GASTROINTESTINAL A:   No acute issues P:   Protonix for GI prophylaxis  HEMATOLOGIC A:   No acute issues P:  Lovenox for DVT prophylaxis  NEUROLOGIC A:   Polysubstance abuse- toxicology positive for amphetamines Alcohol intoxication Possible overdose P:   RASS goal: -1 to -2 Patient is extremely difficult to sedate. Continue propofol, fentanyl and Versed for sedation and vent discomfort  FAMILY  - Updates: Patient's daughter updated at bedside  -  Inter-disciplinary family meet or Palliative Care meeting due by:  day 7   Kehinde Bowdish S. [facility] ANP-BC Pulmonary and Critical Care Medicine [facility] Pager [phone] or [phone]  NB: This document was prepared using Dragon voice recognition software and may include unintentional dictation errors.    12/06/2017, 11:47 PM

## 2017-12-06 NOTE — ED Notes (Addendum)
Etomidate 25 mg given

## 2017-12-06 NOTE — ED Notes (Signed)
Bear hugger applied 

## 2017-12-06 NOTE — Consult Note (Signed)
Pharmacy Antibiotic Note  Emilie RutterGary W Mance Jr. is a 37 y.o. male admitted on 12/06/2017 with drug over dose and possible aspiration pneumonia. Pharmacy originally consulted for Zosyn dosing, but patient had listed allergy of swelling to PCN. Pharmacy now consulted for clindamycin dosing.   Plan: Start Clindamycin 600mg  IV every 8 hours.   Weight: 185 lb (83.9 kg)  Temp (24hrs), Avg:95.1 F (35.1 C), Min:95.1 F (35.1 C), Max:95.1 F (35.1 C)  Recent Labs  Lab 12/06/17 1859 12/06/17 1905  WBC 9.2  --   CREATININE 0.96  --   LATICACIDVEN  --  2.6*    Estimated Creatinine Clearance: 115.6 mL/min (by C-G formula based on SCr of 0.96 mg/dL).    Allergies  Allergen Reactions  . Aspirin   . Penicillins Swelling    Has patient had a PCN reaction causing immediate rash, facial/tongue/throat swelling, SOB or lightheadedness with hypotension: yes, throat swelling Has patient had a PCN reaction causing severe rash involving mucus membranes or skin necrosis: no Has patient had a PCN reaction that required hospitalization: no Has patient had a PCN reaction occurring within the last 10 years: no If all of the above answers are "NO", then may proceed with Cephalosporin use.   . Tegretol [Carbamazepine]     Antimicrobials this admission: 2/9 clindamycin  >>   Dose adjustments this admission:  Microbiology results:  Thank you for allowing pharmacy to be a part of this patient's care.  Gardner CandleSheema M Marylynne Keelin, PharmD, BCPS Clinical Pharmacist 12/06/2017 8:40 PM

## 2017-12-06 NOTE — ED Notes (Addendum)
Rocuronium 70mg given

## 2017-12-06 NOTE — ED Provider Notes (Signed)
[facility] Emergency Department Provider Note  ____________________________________________  Time seen: Approximately 7:18 PM  I have reviewed the triage vital signs and the nursing notes.   HISTORY  Chief Complaint Drug Overdose  Level 5 caveat:  Portions of the history and physical were unable to be obtained due to unresponsive   HPI Gust Eugene. is a 37 y.o. male with a history of heroin abuse, seizure and hypertensionwho presents via EMS after being found unresponsive. EMS was called to patient's house after he was found unresponsive with agonal respirations. Patient received 2 mg of intranasal Narcan with no response. He did receive 4 mg of IV Narcan with minimal improvement of RR but continued to be unresponsive. Patient arrives GCS 3 being bagged by EMS.   Past Medical History:  Diagnosis Date  . H/o Lyme disease   . Heroin abuse (HCC)   . History of TIAs   . Hypertension     Patient Active Problem List   Diagnosis Date Noted  . Drug overdose 12/06/2017  . 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 Opiate abuse, episodic (HCC) 03/18/2017  . Substance induced mood disorder (HCC) 03/18/2017  . Self-inflicted laceration of wrist 03/18/2017    History reviewed. No pertinent surgical history.  Prior to Admission medications   Medication Sig Start Date End Date Taking? Authorizing Provider  doxycycline (VIBRA-TABS) 100 MG tablet Take 1 tablet (100 mg total) by mouth 2 (two) times daily. Patient not taking: Reported on 12/06/2017 03/17/17   Minna Antis, MD  naproxen (NAPROSYN) 500 MG tablet Take 1 tablet (500 mg total) by mouth 2 (two) times daily with a meal. Patient not taking: Reported on 03/18/2017 06/24/16   Kem Boroughs B, FNP  tiZANidine (ZANAFLEX) 4 MG tablet Take 1 tablet (4 mg total) by mouth 3 (three) times daily. Patient not taking: Reported on 03/18/2017 06/24/16   Chinita Pester, FNP  traMADol (ULTRAM) 50 MG tablet Take 1  tablet (50 mg total) by mouth every 6 (six) hours as needed. Patient not taking: Reported on 12/06/2017 06/18/17 06/18/18  Willy Eddy, MD    Allergies Aspirin; Penicillins; and Tegretol [carbamazepine]  No family history on file.  Social History Social History   Tobacco Use  . Smoking status: Current Every Day Smoker    Packs/day: 1.00    Types: Cigars  . Smokeless tobacco: Never Used  Substance Use Topics  . Alcohol use: No  . Drug use: Yes    Types: IV    Comment: .5 bag heroine a day    Review of Systems  Constitutional: + unresponsive Respiratory: + apneic  Level 5 caveat:  Portions of the history and physical were unable to be obtained due to unresponsive  ____________________________________________   PHYSICAL EXAM:  VITAL SIGNS: ED Triage Vitals [12/06/17 1903]  Enc Vitals Group     BP (!) 168/101     Pulse Rate (!) 104     Resp 14     Temp      Temp src      SpO2 100 %     Weight      Height      Head Circumference      Peak Flow      Pain Score      Pain Loc      Pain Edu?      Excl. in GC?     Constitutional: GCS 3 HEENT:      Head: Normocephalic and atraumatic.  Eyes: Pupils 2 mm and sluggish      Mouth/Throat: No blood, poor dentition      Neck: Supple with no signs of meningismus. Cardiovascular: Tachycardic with regular rhythm Respiratory: Bradypnea, assisted respirations with BVM Gastrointestinal: Soft, atraumatic GU: normal Musculoskeletal: Several track marks Neurologic: GCS 3 Skin: Skin is warm, dry and intact. No rash noted  ____________________________________________   LABS (all labs ordered are listed, but only abnormal results are displayed)  Labs Reviewed  COMPREHENSIVE METABOLIC PANEL - Abnormal; Notable for the following components:      Result Value   Glucose, Bld 109 (*)    Anion gap 17 (*)    All other components within normal limits  ETHANOL - Abnormal; Notable for the following components:    Alcohol, Ethyl (B) 169 (*)    All other components within normal limits  ACETAMINOPHEN LEVEL - Abnormal; Notable for the following components:   Acetaminophen (Tylenol), Serum <10 (*)    All other components within normal limits  URINE DRUG SCREEN, QUALITATIVE ([facility] ONLY) - Abnormal; Notable for the following components:   Amphetamines, Ur Screen POSITIVE (*)    All other components within normal limits  LACTIC ACID, PLASMA - Abnormal; Notable for the following components:   Lactic Acid, Venous 2.6 (*)    All other components within normal limits  BLOOD GAS, ARTERIAL - Abnormal; Notable for the following components:   pO2, Arterial 164 (*)    All other components within normal limits  SALICYLATE LEVEL  CBC  TROPONIN I  LACTIC ACID, PLASMA  CBG MONITORING, ED   ____________________________________________  EKG  ED ECG REPORT I, Nita Sickle, the attending physician, personally viewed and interpreted this ECG.  Sinus tachycardia, rate of 104, normal intervals, normal axis, no ST elevations or depressions.  ____________________________________________  RADIOLOGY  Interpreted by me: CXR: ETT in place   Interpretation by Radiologist:  Dg Chest Portable 1 View  Result Date: 12/06/2017 CLINICAL DATA:  Post intubation EXAM: PORTABLE CHEST 1 VIEW COMPARISON:  Portable exam 1922 hours compared to 11/05/2017 FINDINGS: Tip of endotracheal tube projects 6.0 cm above carina. Nasogastric tube extends into abdomen. Normal heart size, mediastinal contours, and pulmonary vascularity. Suspicion of mild LEFT infrahilar infiltrate. Remaining lungs clear. No pleural effusion or pneumothorax. IMPRESSION: Suspect mild LEFT infrahilar infiltrate. Electronically Signed   By: Ulyses Southward M.D.   On: 12/06/2017 19:42     ____________________________________________   PROCEDURES  Procedure(s) performed: yes Procedure Name: Intubation Date/Time: 12/06/2017 8:29 PM Performed by: Nita Sickle, MD Pre-anesthesia Checklist: Patient identified, Patient being monitored, Emergency Drugs available, Timeout performed and Suction available Oxygen Delivery Method: Non-rebreather mask Preoxygenation: Pre-oxygenation with 100% oxygen Induction Type: Rapid sequence Ventilation: Mask ventilation without difficulty Laryngoscope Size: 3 and Glidescope Tube size: 7.5 mm Number of attempts: 1 Airway Equipment and Method: Video-laryngoscopy Placement Confirmation: ETT inserted through vocal cords under direct vision,  CO2 detector and Breath sounds checked- equal and bilateral Secured at: 24 cm Tube secured with: ETT holder Dental Injury: Teeth and Oropharynx as per pre-operative assessment       Critical Care performed: yes  CRITICAL CARE Performed by: Nita Sickle  ?  Total critical care time: 60 min  Critical care time was exclusive of separately billable procedures and treating other patients.  Critical care was necessary to treat or prevent imminent or life-threatening deterioration.  Critical care was time spent personally by me on the following activities: development of treatment plan with patient and/or surrogate as  well as nursing, discussions with consultants, evaluation of patient's response to treatment, examination of patient, obtaining history from patient or surrogate, ordering and performing treatments and interventions, ordering and review of laboratory studies, ordering and review of radiographic studies, pulse oximetry and re-evaluation of patient's condition.  ____________________________________________   INITIAL IMPRESSION / ASSESSMENT AND PLAN / ED COURSE   37 y.o. male with a history of heroin abuse, seizure and hypertensionwho presents via EMS after being found unresponsive. Respiration improved with narcan per EMS prior to to arrival. Patient arrives with GCS of 3 being bagged by EMS. The patient received 12 mg of IV Narcan and after that  started opening his eyes and had slight increased respiratory rate but still unresponsive. He received another 12 mg of IV Narcan with no changes. At this time decision was made to intubate patient for airway protection using rocuronium and etomidate. Intubation was successful per procedure note above. Patient was given 60 mg of propofol after intubation and started on a drip. ABG showing a pH of 7.41 PCO2 of 37 and PO2 of 164. Labs are pending. Presentation concerning for drug overdose.     _________________________ 8:30 PM on 12/06/2017 -----------------------------------------  chest x-ray with possible left infrahilar infiltrate most likely from aspiration pneumonitis.we'll not treat at this time. Patient remains well sedated on propofol. Lactic slightly elevated for which he was given a liter bolus. Alcohol level 169. Patient has been admitted to the ICU.    As part of my medical decision making, I reviewed the following data within the electronic MEDICAL RECORD NUMBER Nursing notes reviewed and incorporated, Labs reviewed , EKG interpreted , Radiograph reviewed , Discussed with admitting physician , Notes from prior ED visits and [place] Controlled Substance Database    Pertinent labs & imaging results that were available during my care of the patient were reviewed by me and considered in my medical decision making (see chart for details).    ____________________________________________   FINAL CLINICAL IMPRESSION(S) / ED DIAGNOSES  Final diagnoses:  Drug overdose, undetermined intent, initial encounter  Acute respiratory failure with hypoxia (HCC)      NEW MEDICATIONS STARTED DURING THIS VISIT:  ED Discharge Orders    None       Note:  This document was prepared using Dragon voice recognition software and may include unintentional dictation errors.    Don PerkingVeronese, [place], MD 12/06/17 2031

## 2017-12-06 NOTE — ED Notes (Signed)
Total of 12 mg of Narcan given with no response. Dr. Don PerkingVeronese at bedside preparing to intubate

## 2017-12-06 NOTE — ED Notes (Signed)
Total of 12 mg of Narcan given

## 2017-12-06 NOTE — ED Notes (Signed)
Dr. Don PerkingVeronese at bedside preparing to intubate

## 2017-12-07 ENCOUNTER — Other Ambulatory Visit: Payer: Self-pay

## 2017-12-07 ENCOUNTER — Inpatient Hospital Stay: Payer: Self-pay

## 2017-12-07 DIAGNOSIS — J9601 Acute respiratory failure with hypoxia: Secondary | ICD-10-CM

## 2017-12-07 LAB — URINE DRUG SCREEN, QUALITATIVE (ARMC ONLY)
Amphetamines, Ur Screen: POSITIVE — AB
BARBITURATES, UR SCREEN: NOT DETECTED
Benzodiazepine, Ur Scrn: POSITIVE — AB
CANNABINOID 50 NG, UR ~~LOC~~: NOT DETECTED
Cocaine Metabolite,Ur ~~LOC~~: NOT DETECTED
MDMA (Ecstasy)Ur Screen: NOT DETECTED
Methadone Scn, Ur: NOT DETECTED
Opiate, Ur Screen: NOT DETECTED
Phencyclidine (PCP) Ur S: NOT DETECTED
TRICYCLIC, UR SCREEN: NOT DETECTED

## 2017-12-07 LAB — BASIC METABOLIC PANEL
Anion gap: 9 (ref 5–15)
BUN: 14 mg/dL (ref 6–20)
CALCIUM: 7.9 mg/dL — AB (ref 8.9–10.3)
CO2: 21 mmol/L — ABNORMAL LOW (ref 22–32)
CREATININE: 0.93 mg/dL (ref 0.61–1.24)
Chloride: 109 mmol/L (ref 101–111)
GFR calc Af Amer: >60 mL/min (ref >60)
GLUCOSE: 77 mg/dL (ref 65–99)
Potassium: 3.8 mmol/L (ref 3.5–5.1)
Sodium: 139 mmol/L (ref 135–145)

## 2017-12-07 LAB — PROTIME-INR
INR: 1.1
Prothrombin Time: 14.1 seconds (ref 11.4–15.2)

## 2017-12-07 LAB — CBC
HCT: 42.7 % (ref 40.0–52.0)
Hemoglobin: 14.2 g/dL (ref 13.0–18.0)
MCH: 29.2 pg (ref 26.0–34.0)
MCHC: 33.2 g/dL (ref 32.0–36.0)
MCV: 88.1 fL (ref 80.0–100.0)
Platelets: 201 10*3/uL (ref 150–440)
RBC: 4.85 MIL/uL (ref 4.40–5.90)
RDW: 14.6 % — AB (ref 11.5–14.5)
WBC: 10.1 10*3/uL (ref 3.8–10.6)

## 2017-12-07 LAB — PROCALCITONIN: Procalcitonin: <0.1 ng/mL

## 2017-12-07 LAB — TRIGLYCERIDES: Triglycerides: 257 mg/dL — ABNORMAL HIGH (ref <150)

## 2017-12-07 LAB — MAGNESIUM: MAGNESIUM: 1.9 mg/dL (ref 1.7–2.4)

## 2017-12-07 LAB — PHOSPHORUS: PHOSPHORUS: 5.1 mg/dL — AB (ref 2.5–4.6)

## 2017-12-07 LAB — GLUCOSE, CAPILLARY
GLUCOSE-CAPILLARY: 86 mg/dL (ref 65–99)
GLUCOSE-CAPILLARY: 93 mg/dL (ref 65–99)

## 2017-12-07 LAB — MRSA PCR SCREENING: MRSA by PCR: NEGATIVE

## 2017-12-07 MED ORDER — VECURONIUM BROMIDE 10 MG IV SOLR
INTRAVENOUS | Status: AC
  Administered 2017-12-07: 10 mg via INTRAVENOUS
  Filled 2017-12-07: qty 10

## 2017-12-07 MED ORDER — MIDAZOLAM HCL 2 MG/2ML IJ SOLN: 2.0000 mg | INTRAMUSCULAR | Status: DC | PRN

## 2017-12-07 MED ORDER — FENTANYL CITRATE (PF) 100 MCG/2ML IJ SOLN
100.0000 ug | Freq: Once | INTRAMUSCULAR | Status: AC
  Administered 2017-12-07: 100 ug via INTRAVENOUS

## 2017-12-07 MED ORDER — SODIUM CHLORIDE 0.9 % IV BOLUS (SEPSIS)
1000.0000 mL | Freq: Once | INTRAVENOUS | Status: AC
  Administered 2017-12-07: 1000 mL via INTRAVENOUS

## 2017-12-07 MED ORDER — FENTANYL BOLUS VIA INFUSION
50.0000 ug | INTRAVENOUS | Status: DC | PRN
  Filled 2017-12-07: qty 50

## 2017-12-07 MED ORDER — MIDAZOLAM HCL 2 MG/2ML IJ SOLN
4.0000 mg | Freq: Once | INTRAMUSCULAR | Status: AC
  Administered 2017-12-07: 4 mg via INTRAVENOUS

## 2017-12-07 MED ORDER — ORAL CARE MOUTH RINSE
15.0000 mL | OROMUCOSAL | Status: DC
  Administered 2017-12-07 (×4): 15 mL via OROMUCOSAL

## 2017-12-07 MED ORDER — DEXMEDETOMIDINE HCL IN NACL 400 MCG/100ML IV SOLN
0.4000 ug/kg/h | INTRAVENOUS | Status: DC
  Administered 2017-12-07: 1 ug/kg/h via INTRAVENOUS
  Filled 2017-12-07 (×2): qty 100

## 2017-12-07 MED ORDER — FENTANYL 2500MCG IN NS 250ML (10MCG/ML) PREMIX INFUSION
25.0000 ug/h | INTRAVENOUS | Status: DC
  Administered 2017-12-07: 100 ug/h via INTRAVENOUS
  Filled 2017-12-07: qty 250

## 2017-12-07 MED ORDER — VECURONIUM BROMIDE 10 MG IV SOLR
10.0000 mg | Freq: Once | INTRAVENOUS | Status: AC
  Administered 2017-12-07: 10 mg via INTRAVENOUS

## 2017-12-07 MED ORDER — MIDAZOLAM HCL 2 MG/2ML IJ SOLN
INTRAMUSCULAR | Status: AC
  Administered 2017-12-07: 4 mg via INTRAVENOUS
  Filled 2017-12-07: qty 4

## 2017-12-07 NOTE — Progress Notes (Signed)
Pt extubated.  Talking, Rest even and unlabored.  Demanding IV removal.

## 2017-12-07 NOTE — Progress Notes (Signed)
PULMONARY / CRITICAL CARE MEDICINE   Name: Isaiah Green. MRN: [id number] DOB: 05-03-1981    ADMISSION DATE:  12/06/2017   CONSULTATION DATE: December 06, 2017  REFERRING MD: Dr. Imogene Burnhen  Reason: Acute respiratory failure  HISTORY OF PRESENT ILLNESS: This is a 37 year old Caucasian male with a past medical history as indicated below current polysubstance abuse who was found unresponsive by family.  When EMS arrived patient was having agonal respirations.  He was given 2 mg of intranasal Narcan with no effect.  At the ED he was given an additional 4 mg of IV Narcan without improvement in his respiratory status hence he was intubated for airway protection.  Actual downtime is unknown His toxicology was positive for amphetamines and benzodiazepines.  His alcohol level was 169 and his salicylate level was negative.  His CT head was negative as well as his chest x-ray.   SUBJECTIVE: Episodes of severe agitation requiring paralytic.  CT head negative.  No other issues  VITAL SIGNS: BP 106/62   Pulse 92   Temp 98.4 F (36.9 C)   Resp 17   Wt 83.9 kg (185 lb)   SpO2 99%   BMI 25.09 kg/m   HEMODYNAMICS:    VENTILATOR SETTINGS: Vent Mode: PRVC FiO2 (%):  [40 %-50 %] 40 % Set Rate:  [18 bmp] 18 bmp Vt Set:  [500 mL] 500 mL PEEP:  [5 cmH20] 5 cmH20 Plateau Pressure:  [15 cmH20] 15 cmH20  INTAKE / OUTPUT: No intake/output data recorded.  PHYSICAL EXAMINATION: General: Sedated Neuro: Withdraws to noxious stimulus, positive gag reflex, pupils are reactive, corneal reflexes intact HEENT: Trachea midline, neck is supple with good range of motion, no JVD Cardiovascular: Apical pulse regular, S1-S2, no murmur regurg or gallop Lungs: To auscultation bilaterally Abdomen: Nondistended, normal bowel sounds Musculoskeletal: No deformities Skin: Warm and dry  LABS:  BMET Recent Labs  Lab 12/06/17 1859 12/07/17 0243  NA 140 139  K 3.7 3.8  CL 101 109  CO2 22 21*  BUN 16 14   CREATININE 0.96 0.93  GLUCOSE 109* 77    Electrolytes Recent Labs  Lab 12/06/17 1859 12/07/17 0243  CALCIUM 8.9 7.9*  MG  --  1.9  PHOS  --  5.1*    CBC Recent Labs  Lab 12/06/17 1859 12/07/17 0243  WBC 9.2 10.1  HGB 15.8 14.2  HCT 47.2 42.7  PLT 187 201    Coag's Recent Labs  Lab 12/07/17 0243  INR 1.10    Sepsis Markers Recent Labs  Lab 12/06/17 1905 12/06/17 2059 12/07/17 0243  LATICACIDVEN 2.6* 3.6*  --   PROCALCITON  --   --  <0.10    ABG Recent Labs  Lab 12/06/17 1912  PHART 7.41  PCO2ART 37  PO2ART 164*    Liver Enzymes Recent Labs  Lab 12/06/17 1859  AST 33  ALT 31  ALKPHOS 49  BILITOT 0.9  ALBUMIN 4.4    Cardiac Enzymes Recent Labs  Lab 12/06/17 1859  TROPONINI <0.03    Glucose No results for input(s): GLUCAP in the last 168 hours.  Imaging Dg Abd 1 View  Result Date: 12/07/2017 CLINICAL DATA:  Nasogastric tube placement. EXAM: ABDOMEN - 1 VIEW COMPARISON:  CT of the abdomen and pelvis from 03/21/2017 FINDINGS: The patient's enteric tube is seen ending overlying the body of the stomach. The stomach contains a small amount of air. Stool is noted largely filling the colon. No free intra-abdominal air is seen, though evaluation  for free air is limited on supine views. No acute osseous abnormalities are identified. The visualized lung bases are grossly clear. IMPRESSION: Enteric tube noted ending overlying the body of the stomach. Electronically Signed   By: Roanna Raider M.D.   On: 12/07/2017 01:52   Ct Head Wo Contrast  Result Date: 12/07/2017 CLINICAL DATA:  Altered level of consciousness EXAM: CT HEAD WITHOUT CONTRAST TECHNIQUE: Contiguous axial images were obtained from the base of the skull through the vertex without intravenous contrast. COMPARISON:  12/07/2015 head CT FINDINGS: Brain: No evidence of acute infarction, hemorrhage, hydrocephalus, extra-axial collection or mass lesion/mass effect. Vascular: No hyperdense  vessel or unexpected calcification. Skull: Normal. Negative for fracture or focal lesion. Sinuses/Orbits: Mucosal thickening in the ethmoid sinuses. No acute orbital abnormality. Other: None IMPRESSION: Negative.  No CT evidence for acute intracranial abnormality. Electronically Signed   By: Jasmine Pang M.D.   On: 12/07/2017 01:27   Dg Chest Portable 1 View  Result Date: 12/06/2017 CLINICAL DATA:  Nasogastric tube placement. EXAM: PORTABLE CHEST 1 VIEW COMPARISON:  Chest radiograph performed earlier today at 8:16 p.m. FINDINGS: The patient's endotracheal tube is seen ending 3 cm above the carina. The patient's enteric tube is seen ending overlying the antrum of the stomach. The lungs are clear bilaterally. No focal consolidation, pleural effusion or pneumothorax is seen. The cardiomediastinal silhouette is normal in size. No acute osseous abnormalities are identified. IMPRESSION: 1. Enteric tube noted ending overlying the antrum of the stomach. 2. Endotracheal tube seen ending 3 cm above the carina. 3. Lungs clear bilaterally. Electronically Signed   By: Roanna Raider M.D.   On: 12/06/2017 21:38   Dg Chest Portable 1 View  Result Date: 12/06/2017 CLINICAL DATA:  Orogastric tube placement.  Hypoxia. EXAM: PORTABLE CHEST 1 VIEW COMPARISON:  Study obtained earlier in the day FINDINGS: Endotracheal tube tip is 4.4 cm above the carina. Orogastric tube coils in the esophagus slightly superior to the aortic arch with the tip extending into the cervical region. Lungs are clear. Heart size and pulmonary vascularity are normal. No adenopathy. No bone lesions. IMPRESSION: Orogastric tube coils in the upper esophagus with the tip directed superiorly into the cervical region. Endotracheal tube tip 4.4 cm above the carina. No pneumothorax. Lungs clear. These results were called by telephone at the time of interpretation on 12/06/2017 at 8:41 pm to Dr. Nita Sickle , who verbally acknowledged these results.  Electronically Signed   By: Bretta Bang III M.D.   On: 12/06/2017 20:44   Dg Chest Portable 1 View  Result Date: 12/06/2017 CLINICAL DATA:  Post intubation EXAM: PORTABLE CHEST 1 VIEW COMPARISON:  Portable exam 1922 hours compared to 11/05/2017 FINDINGS: Tip of endotracheal tube projects 6.0 cm above carina. Nasogastric tube extends into abdomen. Normal heart size, mediastinal contours, and pulmonary vascularity. Suspicion of mild LEFT infrahilar infiltrate. Remaining lungs clear. No pleural effusion or pneumothorax. IMPRESSION: Suspect mild LEFT infrahilar infiltrate. Electronically Signed   By: Ulyses Southward M.D.   On: 12/06/2017 19:42   STUDIES:  None  CULTURES: None  ANTIBIOTICS: None  SIGNIFICANT EVENTS: 12/06/2017: Admitted  LINES/TUBES: Peripheral IVs  DISCUSSION: 37 year old Caucasian male with a history of polysubstance abuse presenting with alcohol intoxication, acute respiratory failure secondary to altered mental status and possible overdose; actual substances unknown  ASSESSMENT / PLAN:  PULMONARY A: Acute respiratory failure secondary to overdose and alcohol intoxication P:   Continue full vent support with current settings ABG and chest x-ray post intubation  reviewed VAP protocol Weaning trials as tolerated  GASTROINTESTINAL A:   No acute issues P:   Protonix for GI prophylaxis  HEMATOLOGIC A:   No acute issues P:  Lovenox for DVT prophylaxis  NEUROLOGIC A:   Polysubstance abuse- toxicology positive for amphetamines Alcohol intoxication Possible overdose P:   RASS goal: -1 to -2 Patient is extremely difficult to sedate. Continue propofol, fentanyl and Versed for sedation and vent discomfort  FAMILY  - Updates: Patient's daughter updated at bedside  - Inter-disciplinary family meet or Palliative Care meeting due by:  day 7   Bartholomew Ramesh S. [facility] ANP-BC Pulmonary and Critical Care Medicine [facility] Pager [phone] or  [phone]  NB: This document was prepared using Dragon voice recognition software and may include unintentional dictation errors.    12/07/2017, 6:43 AM

## 2017-12-07 NOTE — Progress Notes (Signed)
Patient expressed desire to leave hospital encouraged by this RN to stay and continue ordered treatment. Patient refused, [facility] to by [facility]. Expressed understanding of leaving hosptial AMA. Patient provided with pants and paper shirt. Patient refused to be wheeled to door,   Left walking  with visitors at side.

## 2017-12-07 NOTE — Progress Notes (Signed)
PT care assumed at this time.  Pt resting in bed on ventilator with family at bedside.  NAD noted.

## 2017-12-07 NOTE — Progress Notes (Signed)
Pt's Foley discontinued, and NG tube discontinued.  Pt appears calm and cooperative at this time.  Denies need.

## 2017-12-07 NOTE — Consult Note (Signed)
Pharmacy Antibiotic Note  Isaiah RutterGary W Santelli Jr. is a 37 y.o. male admitted on 12/06/2017 with drug over dose and possible aspiration pneumonia. Pharmacy originally consulted for Zosyn dosing, but patient had listed allergy of swelling to PCN. Pharmacy now consulted for clindamycin dosing.   Plan: Start Clindamycin 600mg  IV every 8 hours.   Weight: 185 lb (83.9 kg)  Temp (24hrs), Avg:97.6 F (36.4 C), Min:93.2 F (34 C), Max:99.5 F (37.5 C)  Recent Labs  Lab 12/06/17 1859 12/06/17 1905 12/06/17 2059 12/07/17 0243  WBC 9.2  --   --  10.1  CREATININE 0.96  --   --  0.93  LATICACIDVEN  --  2.6* 3.6*  --     Estimated Creatinine Clearance: 119.4 mL/min (by C-G formula based on SCr of 0.93 mg/dL).    Allergies  Allergen Reactions  . Aspirin   . Penicillins Swelling    Has patient had a PCN reaction causing immediate rash, facial/tongue/throat swelling, SOB or lightheadedness with hypotension: yes, throat swelling Has patient had a PCN reaction causing severe rash involving mucus membranes or skin necrosis: no Has patient had a PCN reaction that required hospitalization: no Has patient had a PCN reaction occurring within the last 10 years: no If all of the above answers are "NO", then may proceed with Cephalosporin use.   . Tegretol [Carbamazepine]     Antimicrobials this admission: 2/9 clindamycin  >>   Dose adjustments this admission:  Microbiology results:  Thank you for allowing pharmacy to be a part of this patient's care.  Angelique BlonderMerrill,Brysyn Brandenberger A, PharmD, BCPS Clinical Pharmacist 12/07/2017 3:13 PM

## 2017-12-07 NOTE — Progress Notes (Signed)
Patient extubated to 2lnc per Dr Lutricia Horsfallonforti's order. Tol well at this time.

## 2017-12-07 NOTE — Progress Notes (Signed)
Decreased to 30% 

## 2017-12-07 NOTE — BH Assessment (Signed)
Request for psych. Consult called in to Clinical research associatewriter, Clinical research associatewriter contacted Dr. Jennet MaduroPucilowska to advise of the request. Dr.  Jennet MaduroPucilowska advised she would follow up.

## 2017-12-07 NOTE — Progress Notes (Signed)
[facility] [place] [facility]   PATIENT NAME: Isaiah PotashGary Green    MR#:  [id number]  DATE OF BIRTH:  [birth year]  SUBJECTIVE:  CHIEF COMPLAINT:   Chief Complaint  Patient presents with  . Drug Overdose   -Patient was found unresponsive at his friend's place. Known history of drug abuse. Are at bedside. -Currently intubated and heavily sedated  REVIEW OF SYSTEMS:  Review of Systems  Unable to perform ROS: Critical illness    DRUG ALLERGIES:   Allergies  Allergen Reactions  . Aspirin   . Penicillins Swelling    Has patient had a PCN reaction causing immediate rash, facial/tongue/throat swelling, SOB or lightheadedness with hypotension: yes, throat swelling Has patient had a PCN reaction causing severe rash involving mucus membranes or skin necrosis: no Has patient had a PCN reaction that required hospitalization: no Has patient had a PCN reaction occurring within the last 10 years: no If all of the above answers are "NO", then may proceed with Cephalosporin use.   . Tegretol [Carbamazepine]     VITALS:  Blood pressure (!) 106/57, pulse 90, temperature 98.8 F (37.1 C), resp. rate 17, weight 83.9 kg (185 lb), SpO2 96 %.  PHYSICAL EXAMINATION:  Physical Exam  GENERAL:  37 y.o.-year-old patient lying in the bed, critically ill appearing, intubated on ventilator.  EYES: Pupils equal, round, sluggish reaction  to light and accommodation. No scleral icterus. Extraocular muscles intact.  HEENT: Head atraumatic, normocephalic. Oropharynx and nasopharynx clear.  NECK:  Supple, no jugular venous distention. No thyroid enlargement, no tenderness.  LUNGS: Normal breath sounds bilaterally, no wheezing, rales,rhonchi or crepitation. No use of accessory muscles of respiration. Decreased bibasilar breath sounds CARDIOVASCULAR: S1, S2 normal. No murmurs, rubs, or gallops.  ABDOMEN: Soft, nontender, nondistended. Bowel sounds present. No organomegaly or mass.    EXTREMITIES: No pedal edema, cyanosis, or clubbing.  NEUROLOGIC: sedated, not awake, not following commands  PSYCHIATRIC: The patient is sedated SKIN: No obvious rash, lesion, or ulcer.    LABORATORY PANEL:   CBC Recent Labs  Lab 12/07/17 0243  WBC 10.1  HGB 14.2  HCT 42.7  PLT 201   ------------------------------------------------------------------------------------------------------------------  Chemistries  Recent Labs  Lab 12/06/17 1859 12/07/17 0243  NA 140 139  K 3.7 3.8  CL 101 109  CO2 22 21*  GLUCOSE 109* 77  BUN 16 14  CREATININE 0.96 0.93  CALCIUM 8.9 7.9*  MG  --  1.9  AST 33  --   ALT 31  --   ALKPHOS 49  --   BILITOT 0.9  --    ------------------------------------------------------------------------------------------------------------------  Cardiac Enzymes Recent Labs  Lab 12/06/17 1859  TROPONINI <0.03   ------------------------------------------------------------------------------------------------------------------  RADIOLOGY:  Dg Abd 1 View  Result Date: 12/07/2017 CLINICAL DATA:  Nasogastric tube placement. EXAM: ABDOMEN - 1 VIEW COMPARISON:  CT of the abdomen and pelvis from 03/21/2017 FINDINGS: The patient's enteric tube is seen ending overlying the body of the stomach. The stomach contains a small amount of air. Stool is noted largely filling the colon. No free intra-abdominal air is seen, though evaluation for free air is limited on supine views. No acute osseous abnormalities are identified. The visualized lung bases are grossly clear. IMPRESSION: Enteric tube noted ending overlying the body of the stomach. Electronically Signed   By: Roanna RaiderJeffery  Chang M.D.   On: 12/07/2017 01:52   Ct Head Wo Contrast  Result Date: 12/07/2017 CLINICAL DATA:  Altered level of consciousness EXAM: CT HEAD  WITHOUT CONTRAST TECHNIQUE: Contiguous axial images were obtained from the base of the skull through the vertex without intravenous contrast. COMPARISON:   12/07/2015 head CT FINDINGS: Brain: No evidence of acute infarction, hemorrhage, hydrocephalus, extra-axial collection or mass lesion/mass effect. Vascular: No hyperdense vessel or unexpected calcification. Skull: Normal. Negative for fracture or focal lesion. Sinuses/Orbits: Mucosal thickening in the ethmoid sinuses. No acute orbital abnormality. Other: None IMPRESSION: Negative.  No CT evidence for acute intracranial abnormality. Electronically Signed   By: Jasmine Pang M.D.   On: 12/07/2017 01:27   Portable Chest Xray  Result Date: 12/07/2017 CLINICAL DATA:  Respiratory failure EXAM: PORTABLE CHEST 1 VIEW COMPARISON:  12/06/2017 FINDINGS: Endotracheal tube and NG tube are unchanged. Low lung volumes with minimal bibasilar atelectasis. Heart is normal size. No effusions or pneumothorax. IMPRESSION: Low lung volumes.  Minimal bibasilar atelectasis. Electronically Signed   By: Charlett Nose M.D.   On: 12/07/2017 07:31   Dg Chest Portable 1 View  Result Date: 12/06/2017 CLINICAL DATA:  Nasogastric tube placement. EXAM: PORTABLE CHEST 1 VIEW COMPARISON:  Chest radiograph performed earlier today at 8:16 p.m. FINDINGS: The patient's endotracheal tube is seen ending 3 cm above the carina. The patient's enteric tube is seen ending overlying the antrum of the stomach. The lungs are clear bilaterally. No focal consolidation, pleural effusion or pneumothorax is seen. The cardiomediastinal silhouette is normal in size. No acute osseous abnormalities are identified. IMPRESSION: 1. Enteric tube noted ending overlying the antrum of the stomach. 2. Endotracheal tube seen ending 3 cm above the carina. 3. Lungs clear bilaterally. Electronically Signed   By: Roanna Raider M.D.   On: 12/06/2017 21:38   Dg Chest Portable 1 View  Result Date: 12/06/2017 CLINICAL DATA:  Orogastric tube placement.  Hypoxia. EXAM: PORTABLE CHEST 1 VIEW COMPARISON:  Study obtained earlier in the day FINDINGS: Endotracheal tube tip is 4.4 cm  above the carina. Orogastric tube coils in the esophagus slightly superior to the aortic arch with the tip extending into the cervical region. Lungs are clear. Heart size and pulmonary vascularity are normal. No adenopathy. No bone lesions. IMPRESSION: Orogastric tube coils in the upper esophagus with the tip directed superiorly into the cervical region. Endotracheal tube tip 4.4 cm above the carina. No pneumothorax. Lungs clear. These results were called by telephone at the time of interpretation on 12/06/2017 at 8:41 pm to Dr. Nita Sickle , who verbally acknowledged these results. Electronically Signed   By: Bretta Bang III M.D.   On: 12/06/2017 20:44   Dg Chest Portable 1 View  Result Date: 12/06/2017 CLINICAL DATA:  Post intubation EXAM: PORTABLE CHEST 1 VIEW COMPARISON:  Portable exam 1922 hours compared to 11/05/2017 FINDINGS: Tip of endotracheal tube projects 6.0 cm above carina. Nasogastric tube extends into abdomen. Normal heart size, mediastinal contours, and pulmonary vascularity. Suspicion of mild LEFT infrahilar infiltrate. Remaining lungs clear. No pleural effusion or pneumothorax. IMPRESSION: Suspect mild LEFT infrahilar infiltrate. Electronically Signed   By: Ulyses Southward M.D.   On: 12/06/2017 19:42    EKG:   Orders placed or performed during the hospital encounter of 12/06/17  . ED EKG  . ED EKG    ASSESSMENT AND PLAN:   37 year old male with past medical history significant for polysubstance abuse including IV drug abuse, history of TIAs, hypertension and Lyme's disease was found unresponsive.  1. Acute metabolic encephalopathy-possible toxic metabolic encephalopathy, underlying medication intake. -Intubated for airway protection. Currently heavily sedated. -Continue to withdraw sedation and assess  neurologically -CT of the head showing no acute intracranial abnormality -Pro calcitonin is negative, less likely to be infection -Urine tox screen is positive for only  amphetamines  2. Drug overdose-with history of IV drug abuse -Urine tox only positive for amphetamines -Currently under involuntary commitment. Psych consult pending  3. Acute respiratory failure-secondary to mental status -Intubated for airway protection. On clindamycin for possible aspiration pneumonia -Management per ICU team  4. DVT prophylaxis-on Lovenox     All the records are reviewed and case discussed with Care Management/Social Workerr. Management plans discussed with the patient, family and they are in agreement.  CODE STATUS: Full code  TOTAL TIME TAKING CARE OF THIS PATIENT: 38 minutes.   POSSIBLE D/C IN  1-2  DAYS, DEPENDING ON CLINICAL CONDITION.   Enid Baas M.D on 12/07/2017 at 8:42 AM  Between 7am to 6pm - Pager - [phone]  After 6pm go to [email or web address] - password [email or web address]  [facility]  Office  [phone]  CC: Primary care physician; Patient, No Pcp Per

## 2017-12-08 NOTE — Discharge Summary (Signed)
Physician Discharge Summary  Patient ID: Isaiah RutterGary W Hird Jr. MRN: [id number] DOB/AGE: 1981-04-02 37 y.o.  Admit date: 12/06/2017 Discharge date: 12/08/2017  Admission Diagnoses:  Discharge Diagnoses:  Active Problems:   Drug overdose Respiratory failure ETOH intoxication  Discharged Condition: stable  Hospital Course: Isaiah Green is a 37 year old gentleman with a past medical history of polysubstance abuse who was found unresponsive. EMS was called patient was noted to have agonal respirations. Was given multiple doses of intranasal Narcan and he was subsequently intubated for airway protection. Unknown time frame for when he was unresponsive, patient never did lose his pulse will require CPR. His toxicology screen was positive for amphetamines benzodiazepine and EtOH. In talking with his daughter. She states he has been using IV drugs for approximately 2 years and his drug of choice is heroin other was heroin tox screen was negative. He generally injects into his arms. He also has other psychiatric issues in does cut himself. Of note his lactic acid was 3.6, mildly elevated triglycerides, salicylate and Tylenol levels within normal limits, CT scan of the head was negative, chest x-ray did not reveal any evidence of aspiration. Patient required one time dose of paralytics secondary to severe agitation, presently on propofol and fentanyl. He passed SBT and was successfully extubated. He passed his swallow screen and was started on a diet. Post extubation, patient insisted he wanted to leave the hospital. He denied any intentions of harming himself or anyone. Patient signed AMA papers and left accompanied by his daughter   Consults: psychiatry  Significant Diagnostic Studies:.unremarkable  Treatments:see treatment plan  Discharge Exam: Blood pressure 126/82, pulse (!) 116, temperature 98.2 F (36.8 C), resp. rate (!) 21, height 6' (1.829 m), weight 83.9 kg (185 lb), SpO2 97 %.  General: awake,  belligerent  Neuro: AAO X4, no focal deficits HEENT: Trachea midline, neck is supple with good range of motion, no JVD Cardiovascular: Apical pulse regular, S1-S2, no murmur regurg or gallop Lungs: To auscultation bilaterally Abdomen: Nondistended, normal bowel sounds Musculoskeletal: No deformities Skin: Warm and dry   Disposition: 07-Left Against Medical Advice/Left Without Being Seen/Elopement   Allergies as of 12/07/2017      Reactions   Aspirin    Penicillins Swelling   Has patient had a PCN reaction causing immediate rash, facial/tongue/throat swelling, SOB or lightheadedness with hypotension: yes, throat swelling Has patient had a PCN reaction causing severe rash involving mucus membranes or skin necrosis: no Has patient had a PCN reaction that required hospitalization: no Has patient had a PCN reaction occurring within the last 10 years: no If all of the above answers are "NO", then may proceed with Cephalosporin use.   Tegretol [carbamazepine]       Medication List    ASK your doctor about these medications   doxycycline 100 MG tablet Commonly known as:  VIBRA-TABS Take 1 tablet (100 mg total) by mouth 2 (two) times daily.   naproxen 500 MG tablet Commonly known as:  NAPROSYN Take 1 tablet (500 mg total) by mouth 2 (two) times daily with a meal.   tiZANidine 4 MG tablet Commonly known as:  ZANAFLEX Take 1 tablet (4 mg total) by mouth 3 (three) times daily.   traMADol 50 MG tablet Commonly known as:  ULTRAM Take 1 tablet (50 mg total) by mouth every 6 (six) hours as needed.        Signed: Mag[facility]ene [facility]. [facility] ANP-BC Pulmonary and Critical Care Medicine [facility] Pager [phone] or [phone]  NB:  This document was prepared using Dragon voice recognition software and may include unintentional dictation errors.   12/08/2017, 7:22 AM

## 2017-12-09 LAB — HIV ANTIBODY (ROUTINE TESTING W REFLEX): HIV SCREEN 4TH GENERATION: NONREACTIVE

## 2018-01-07 ENCOUNTER — Other Ambulatory Visit: Payer: Self-pay

## 2018-01-07 ENCOUNTER — Emergency Department: Payer: Self-pay

## 2018-01-07 ENCOUNTER — Emergency Department
Admission: EM | Admit: 2018-01-07 | Discharge: 2018-01-07 | Disposition: A | Discharge status: Home / Self Care | Payer: Self-pay | Attending: Emergency Medicine | Admitting: Emergency Medicine

## 2018-01-07 DIAGNOSIS — R1110 Vomiting, unspecified: Secondary | ICD-10-CM | POA: Insufficient documentation

## 2018-01-07 DIAGNOSIS — R4182 Altered mental status, unspecified: Secondary | ICD-10-CM | POA: Insufficient documentation

## 2018-01-07 DIAGNOSIS — S0181XA Laceration without foreign body of other part of head, initial encounter: Secondary | ICD-10-CM | POA: Insufficient documentation

## 2018-01-07 DIAGNOSIS — S0990XA Unspecified injury of head, initial encounter: Secondary | ICD-10-CM

## 2018-01-07 DIAGNOSIS — I10 Essential (primary) hypertension: Secondary | ICD-10-CM | POA: Insufficient documentation

## 2018-01-07 DIAGNOSIS — Y999 Unspecified external cause status: Secondary | ICD-10-CM | POA: Insufficient documentation

## 2018-01-07 DIAGNOSIS — F17210 Nicotine dependence, cigarettes, uncomplicated: Secondary | ICD-10-CM | POA: Insufficient documentation

## 2018-01-07 DIAGNOSIS — W2209XA Striking against other stationary object, initial encounter: Secondary | ICD-10-CM | POA: Insufficient documentation

## 2018-01-07 DIAGNOSIS — Y92810 Car as the place of occurrence of the external cause: Secondary | ICD-10-CM | POA: Insufficient documentation

## 2018-01-07 DIAGNOSIS — Y939 Activity, unspecified: Secondary | ICD-10-CM | POA: Insufficient documentation

## 2018-01-07 DIAGNOSIS — Z8673 Personal history of transient ischemic attack (TIA), and cerebral infarction without residual deficits: Secondary | ICD-10-CM | POA: Insufficient documentation

## 2018-01-07 LAB — URINE DRUG SCREEN, QUALITATIVE (ARMC ONLY)
Amphetamines, Ur Screen: POSITIVE — AB
BARBITURATES, UR SCREEN: NOT DETECTED
BENZODIAZEPINE, UR SCRN: NOT DETECTED
COCAINE METABOLITE, UR ~~LOC~~: POSITIVE — AB
Cannabinoid 50 Ng, Ur ~~LOC~~: NOT DETECTED
MDMA (Ecstasy)Ur Screen: NOT DETECTED
Methadone Scn, Ur: NOT DETECTED
Opiate, Ur Screen: NOT DETECTED
Phencyclidine (PCP) Ur S: NOT DETECTED
Tricyclic, Ur Screen: NOT DETECTED

## 2018-01-07 LAB — CBC
HEMATOCRIT: 42.7 % (ref 40.0–52.0)
HEMOGLOBIN: 14.3 g/dL (ref 13.0–18.0)
MCH: 28.8 pg (ref 26.0–34.0)
MCHC: 33.5 g/dL (ref 32.0–36.0)
MCV: 86.1 fL (ref 80.0–100.0)
Platelets: 225 10*3/uL (ref 150–440)
RBC: 4.96 MIL/uL (ref 4.40–5.90)
RDW: 14.2 % (ref 11.5–14.5)
WBC: 8.7 10*3/uL (ref 3.8–10.6)

## 2018-01-07 LAB — COMPREHENSIVE METABOLIC PANEL
ALK PHOS: 59 U/L (ref 38–126)
ALT: 25 U/L (ref 17–63)
ANION GAP: 8 (ref 5–15)
AST: 35 U/L (ref 15–41)
Albumin: 3.9 g/dL (ref 3.5–5.0)
BILIRUBIN TOTAL: 0.5 mg/dL (ref 0.3–1.2)
BUN: 12 mg/dL (ref 6–20)
CALCIUM: 8.8 mg/dL — AB (ref 8.9–10.3)
CO2: 25 mmol/L (ref 22–32)
Chloride: 107 mmol/L (ref 101–111)
Creatinine, Ser: 1.04 mg/dL (ref 0.61–1.24)
GFR calc non Af Amer: >60 mL/min (ref >60)
GLUCOSE: 125 mg/dL — AB (ref 65–99)
Potassium: 3.5 mmol/L (ref 3.5–5.1)
Sodium: 140 mmol/L (ref 135–145)
TOTAL PROTEIN: 6.9 g/dL (ref 6.5–8.1)

## 2018-01-07 LAB — ACETAMINOPHEN LEVEL

## 2018-01-07 LAB — SALICYLATE LEVEL

## 2018-01-07 LAB — ETHANOL: Alcohol, Ethyl (B): <10 mg/dL (ref <10)

## 2018-01-07 NOTE — Discharge Instructions (Signed)
You have been seen in the emergency department after head injury today.  Please follow-up with your doctor within the next 7 days for recheck/reevaluation.
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
## 2018-01-07 NOTE — ED Provider Notes (Signed)
[facility] Emergency Department Provider Note  Time seen: 8:45 PM  I have reviewed the triage vital signs and the nursing notes.   HISTORY  Chief Complaint Laceration    HPI Isaiah Green. is a 37 y.o. male with a past medical history of substance abuse, hypertension, presents to the emergency department with police for altered mental status.  According to police the patient was arrested, in the back of the police car reportedly continuously hit his head on the divider until he suffered swelling to the forehead with a laceration.  Upon arrival here the patient was very altered and somnolent.  Was initially seen by 1 of our physician assistants but was transferred to the main side of the emergency department due to altered mental status.  Upon my evaluation the patient is awake and alert, when asked what happened tonight he states "it was a bad night."  Patient denies any alcohol use tonight, denies any substance use recently.  Patient's only complaint at this time is right shoulder pain headache.  Largely negative review of systems otherwise.  Past Medical History:  Diagnosis Date  . H/o Lyme disease   . Heroin abuse (HCC)   . History of TIAs   . Hypertension     Patient Active Problem List   Diagnosis Date Noted  . Drug overdose 12/06/2017  . 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 Opiate abuse, episodic (HCC) 03/18/2017  . Substance induced mood disorder (HCC) 03/18/2017  . Self-inflicted laceration of wrist 03/18/2017    No past surgical history on file.  Prior to Admission medications   Medication Sig Start Date End Date Taking? Authorizing Provider  doxycycline (VIBRA-TABS) 100 MG tablet Take 1 tablet (100 mg total) by mouth 2 (two) times daily. Patient not taking: Reported on 12/06/2017 03/17/17   Minna Antis, MD  naproxen (NAPROSYN) 500 MG tablet Take 1 tablet (500 mg total) by mouth 2 (two) times daily with a meal. Patient not taking:  Reported on 03/18/2017 06/24/16   Kem Boroughs B, FNP  tiZANidine (ZANAFLEX) 4 MG tablet Take 1 tablet (4 mg total) by mouth 3 (three) times daily. Patient not taking: Reported on 03/18/2017 06/24/16   Chinita Pester, FNP  traMADol (ULTRAM) 50 MG tablet Take 1 tablet (50 mg total) by mouth every 6 (six) hours as needed. Patient not taking: Reported on 12/06/2017 06/18/17 06/18/18  Willy Eddy, MD    Allergies  Allergen Reactions  . Aspirin   . Penicillins Swelling    Has patient had a PCN reaction causing immediate rash, facial/tongue/throat swelling, SOB or lightheadedness with hypotension: yes, throat swelling Has patient had a PCN reaction causing severe rash involving mucus membranes or skin necrosis: no Has patient had a PCN reaction that required hospitalization: no Has patient had a PCN reaction occurring within the last 10 years: no If all of the above answers are "NO", then may proceed with Cephalosporin use.   . Tegretol [Carbamazepine]     No family history on file.  Social History Social History   Tobacco Use  . Smoking status: Current Every Day Smoker    Packs/day: 1.00    Types: Cigars  . Smokeless tobacco: Never Used  Substance Use Topics  . Alcohol use: No  . Drug use: Yes    Types: IV    Comment: .5 bag heroine a day    Review of Systems Constitutional: Negative for no loss of consciousness. Eyes: Negative for visual complaints ENT: Forehead  injury, hematoma/laceration Cardiovascular: Negative for chest pain. Respiratory: Negative for shortness of breath. Gastrointestinal: Negative for abdominal pain Genitourinary: Negative for urinary compaints Musculoskeletal: Right shoulder pain Skin: Laceration to central forehead, hemostatic, status post Dermabond Neurological: Positive for headache All other ROS negative  ____________________________________________   PHYSICAL EXAM:  VITAL SIGNS: ED Triage Vitals  Enc Vitals Group     BP 01/07/18 1936  (!) 148/74     Pulse Rate 01/07/18 1936 (!) 110     Resp 01/07/18 1936 18     Temp 01/07/18 1936 98 F (36.7 C)     Temp Source 01/07/18 1936 Oral     SpO2 01/07/18 1936 96 %     Weight 01/07/18 1938 198 lb (89.8 kg)     Height 01/07/18 1938 6' (1.829 m)     Head Circumference --      Peak Flow --      Pain Score --      Pain Loc --      Pain Edu? --      Excl. in GC? --     Constitutional: Alert, lying in bed, keeps his eyes closed through most of the exam.  No distress.  Noted to have a hematoma with small laceration to his forehead. Eyes: Normal exam ENT   Head: Moderate central hematoma to the forehead with a small laceration.   Nose: No congestion/rhinnorhea.   Mouth/Throat: Mucous membranes are moist. Cardiovascular: Normal rate, regular rhythm around 100 bpm Respiratory: Normal respiratory effort without tachypnea nor retractions. Breath sounds are clear  Gastrointestinal: Soft and nontender. No distention.   Musculoskeletal: Moderate right shoulder tenderness to palpation.  Neurovascular intact distally.  Currently handcuffed. Neurologic:  Normal speech and language. No gross focal neurologic deficits on my limited neurological exam Skin:  Skin is warm.  Small laceration forehead status post Dermabond.  Hemostatic Psychiatric: Mood and affect are normal during my evaluation.  ____________________________________________   RADIOLOGY  CT head negative  EKG reviewed and interpreted by myself shows normal sinus rhythm at 96 bpm with a narrow QRS, normal axis, normal intervals, nonspecific ST changes without ST elevation.  No acute abnormality.  ____________________________________________   INITIAL IMPRESSION / ASSESSMENT AND PLAN / ED COURSE  Pertinent labs & imaging results that were available during my care of the patient were reviewed by me and considered in my medical decision making (see chart for details).  Patient presents to the emergency  department by police after being arrested initially for head injury, now with altered mental status requesting clearance for jail.  Currently during my evaluation the patient is awake alert, answering questions appropriately.  Complaining of right shoulder pain and a headache.  CT scan of the head is negative.  We will check labs attempt to obtain urine for a drug screen analysis, will obtain shoulder x-rays.  Overall the patient appears well at this time.  Dermabond applied by physician assistant prior to my evaluation to the small laceration to the forehead.  Hemostatic.  Labs are largely within normal limits, urine drug screen pending.  Patient is EKG is nonrevealing.  Patient will be discharged into police custody.  ____________________________________________   FINAL CLINICAL IMPRESSION(S) / ED DIAGNOSES  Altered mental status Head injury Laceration    Minna AntisPaduchowski, Avier Jech, MD 01/07/18 2329

## 2018-01-07 NOTE — ED Provider Notes (Signed)
[facility] EMERGENCY DEPARTMENT Provider Note   CSN: [id number] Arrival date & time: 01/07/18  1931     History   Chief Complaint Chief Complaint  Patient presents with  . Laceration    HPI Isaiah RutterGary W Banales Jr. is a 37 y.o. male presents to the emergency department [facility].  Sheriff states patient was very combative and incoherent.  He was placed into the patrol car and began beating his head against the Plexiglas.  Sheriff denies any loss of consciousness, vomiting.  History is limited by patient, he is not answering questions appropriately.  He will not follow simple commands.  He appears to be agitated.  He does admit to having headache as well as using methamphetamine last night.   HPI  Past Medical History:  Diagnosis Date  . H/o Lyme disease   . Heroin abuse (HCC)   . History of TIAs   . Hypertension     Patient Active Problem List   Diagnosis Date Noted  . Drug overdose 12/06/2017  . 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 Opiate abuse, episodic (HCC) 03/18/2017  . Substance induced mood disorder (HCC) 03/18/2017  . Self-inflicted laceration of wrist 03/18/2017    No past surgical history on file.     Home Medications    Prior to Admission medications   Medication Sig Start Date End Date Taking? Authorizing Provider  doxycycline (VIBRA-TABS) 100 MG tablet Take 1 tablet (100 mg total) by mouth 2 (two) times daily. Patient not taking: Reported on 12/06/2017 03/17/17   Minna AntisPaduchowski, Kevin, MD  naproxen (NAPROSYN) 500 MG tablet Take 1 tablet (500 mg total) by mouth 2 (two) times daily with a meal. Patient not taking: Reported on 03/18/2017 06/24/16   Kem Boroughsriplett, Cari B, FNP  tiZANidine (ZANAFLEX) 4 MG tablet Take 1 tablet (4 mg total) by mouth 3 (three) times daily. Patient not taking: Reported on 03/18/2017 06/24/16   Chinita Pesterriplett, Cari B, FNP  traMADol (ULTRAM) 50 MG tablet Take 1 tablet (50 mg total) by mouth every 6 (six)  hours as needed. Patient not taking: Reported on 12/06/2017 06/18/17 06/18/18  Willy Eddyobinson, Patrick, MD    Family History No family history on file.  Social History Social History   Tobacco Use  . Smoking status: Current Every Day Smoker    Packs/day: 1.00    Types: Cigars  . Smokeless tobacco: Never Used  Substance Use Topics  . Alcohol use: No  . Drug use: Yes    Types: IV    Comment: .5 bag heroine a day     Allergies   Aspirin; Penicillins; and Tegretol [carbamazepine]   Review of Systems Review of Systems  Unable to perform ROS: Mental status change     Physical Exam Updated Vital Signs BP 116/61 (BP Location: Right Arm)   Pulse (!) 114   Temp 98.4 F (36.9 C) (Oral)   Resp 18   Ht 6' (1.829 m)   Wt 89.8 kg (198 lb)   SpO2 96%   BMI 26.85 kg/m   Physical Exam  HENT:  2.5 cm laceration, superficial linear to the mid forehead.  No visible or palpable foreign body.  Bleeding well controlled.  Skin cleaned with Betadine and Dermabond applied.  Minimal soft tissue swelling with no ecchymosis.  Eyes: Conjunctivae are normal.  Pupils are constricted.  Pulmonary/Chest: Effort normal. No respiratory distress.  Psychiatric: He is agitated. He is not actively hallucinating. He is inattentive.  ED Treatments / Results  Labs (all labs ordered are listed, but only abnormal results are displayed) Labs Reviewed  CBC  COMPREHENSIVE METABOLIC PANEL  ETHANOL  ACETAMINOPHEN LEVEL  SALICYLATE LEVEL  URINE DRUG SCREEN, QUALITATIVE ([facility] ONLY)    EKG  EKG Interpretation None       Radiology No results found.  Procedures .Marland KitchenLaceration Repair Date/Time: 01/07/2018 8:16 PM Performed by: Evon Slack, PA-C Authorized by: Evon Slack, PA-C   Anesthesia (see MAR for exact dosages):    Anesthesia method:  None Laceration details:    Location:  Face   Face location:  Forehead   Length (cm):  2.5   Depth (mm):  2 Repair type:    Repair type:   Simple Exploration:    Contaminated: no   Treatment:    Area cleansed with:  Betadine   Amount of cleaning:  Standard   Irrigation solution:  Tap water Skin repair:    Repair method:  Tissue adhesive Approximation:    Approximation:  Close   Vermilion border: well-aligned   Post-procedure details:    Dressing:  Open (no dressing)   (including critical care time)  Medications Ordered in ED Medications - No data to display   Initial Impression / Assessment and Plan / ED Course  I have reviewed the triage vital signs and the nursing notes.  Pertinent labs & imaging results that were available during my care of the patient were reviewed by me and considered in my medical decision making (see chart for details).     38 year old male with history of polysubstance abuse and recent drug overdose presents to the emergency department with laceration to the forehead.  Patient is uncooperative and unable to provide history.  Patient will not follow simple commands or answer some simple questions.  Patient moved over to the major side for further workup and evaluation.  Case discussed with Dr. Lenard Lance.  CT head and labs pending.  Final Clinical Impressions(s) / ED Diagnoses   Final diagnoses:  Laceration of forehead, initial encounter  Traumatic injury of head, initial encounter  Altered mental status, unspecified altered mental status type    ED Discharge Orders    None       Ronnette Juniper 01/07/18 2021    Minna Antis, MD 01/07/18 2259

## 2018-01-07 NOTE — ED Notes (Signed)
Acuity to 3 will move to major side for workup

## 2018-02-21 ENCOUNTER — Encounter: Payer: Self-pay | Admitting: Emergency Medicine

## 2018-02-21 ENCOUNTER — Emergency Department: Payer: Self-pay

## 2018-02-21 ENCOUNTER — Emergency Department
Admission: EM | Admit: 2018-02-21 | Discharge: 2018-02-21 | Disposition: A | Discharge status: Home / Self Care | Payer: Self-pay | Attending: Emergency Medicine | Admitting: Emergency Medicine

## 2018-02-21 DIAGNOSIS — I10 Essential (primary) hypertension: Secondary | ICD-10-CM | POA: Insufficient documentation

## 2018-02-21 DIAGNOSIS — F17290 Nicotine dependence, other tobacco product, uncomplicated: Secondary | ICD-10-CM | POA: Insufficient documentation

## 2018-02-21 DIAGNOSIS — F1510 Other stimulant abuse, uncomplicated: Secondary | ICD-10-CM

## 2018-02-21 DIAGNOSIS — Z8673 Personal history of transient ischemic attack (TIA), and cerebral infarction without residual deficits: Secondary | ICD-10-CM | POA: Insufficient documentation

## 2018-02-21 DIAGNOSIS — R0789 Other chest pain: Secondary | ICD-10-CM

## 2018-02-21 LAB — CK: Total CK: 212 U/L (ref 49–397)

## 2018-02-21 LAB — CBC WITH DIFFERENTIAL/PLATELET
Basophils Absolute: 0 10*3/uL (ref 0–0.1)
Basophils Relative: 1 %
Eosinophils Absolute: 0.2 10*3/uL (ref 0–0.7)
Eosinophils Relative: 3 %
HEMATOCRIT: 44.8 % (ref 40.0–52.0)
HEMOGLOBIN: 14.9 g/dL (ref 13.0–18.0)
LYMPHS ABS: 1.7 10*3/uL (ref 1.0–3.6)
LYMPHS PCT: 28 %
MCH: 28.6 pg (ref 26.0–34.0)
MCHC: 33.2 g/dL (ref 32.0–36.0)
MCV: 86.1 fL (ref 80.0–100.0)
MONO ABS: 0.6 10*3/uL (ref 0.2–1.0)
MONOS PCT: 10 %
NEUTROS ABS: 3.8 10*3/uL (ref 1.4–6.5)
NEUTROS PCT: 60 %
Platelets: 227 10*3/uL (ref 150–440)
RBC: 5.21 MIL/uL (ref 4.40–5.90)
RDW: 14.3 % (ref 11.5–14.5)
WBC: 6.3 10*3/uL (ref 3.8–10.6)

## 2018-02-21 LAB — COMPREHENSIVE METABOLIC PANEL
ALK PHOS: 68 U/L (ref 38–126)
ALT: 54 U/L (ref 17–63)
AST: 36 U/L (ref 15–41)
Albumin: 4.2 g/dL (ref 3.5–5.0)
Anion gap: 7 (ref 5–15)
BUN: 16 mg/dL (ref 6–20)
CALCIUM: 8.4 mg/dL — AB (ref 8.9–10.3)
CHLORIDE: 107 mmol/L (ref 101–111)
CO2: 24 mmol/L (ref 22–32)
CREATININE: 0.74 mg/dL (ref 0.61–1.24)
GFR calc non Af Amer: >60 mL/min (ref >60)
GLUCOSE: 90 mg/dL (ref 65–99)
Potassium: 3.8 mmol/L (ref 3.5–5.1)
SODIUM: 138 mmol/L (ref 135–145)
Total Bilirubin: 1 mg/dL (ref 0.3–1.2)
Total Protein: 7.1 g/dL (ref 6.5–8.1)

## 2018-02-21 LAB — BRAIN NATRIURETIC PEPTIDE: B Natriuretic Peptide: 6 pg/mL (ref 0.0–100.0)

## 2018-02-21 LAB — TROPONIN I

## 2018-02-21 LAB — ETHANOL: Alcohol, Ethyl (B): <10 mg/dL (ref <10)

## 2018-02-21 MED ORDER — DICLOFENAC SODIUM 50 MG PO TBEC: 50.0000 mg | DELAYED_RELEASE_TABLET | Freq: Two times a day (BID) | ORAL | 0 refills | Status: AC

## 2018-02-21 MED ORDER — KETOROLAC TROMETHAMINE 30 MG/ML IJ SOLN
15.0000 mg | Freq: Once | INTRAMUSCULAR | Status: AC
  Administered 2018-02-21: 15 mg via INTRAVENOUS
  Filled 2018-02-21: qty 1

## 2018-02-21 MED ORDER — FENTANYL CITRATE (PF) 100 MCG/2ML IJ SOLN
100.0000 ug | Freq: Once | INTRAMUSCULAR | Status: AC
  Administered 2018-02-21: 100 ug via INTRAVENOUS
  Filled 2018-02-21: qty 2

## 2018-02-21 NOTE — ED Provider Notes (Signed)
[facility] Emergency Department Provider Note  ____________________________________________   First MD Initiated Contact with Patient 02/21/18 [phone]     (approximate)  I have reviewed the triage vital signs and the nursing notes.   HISTORY  Chief Complaint Chest Pain   HPI Isaiah Green. is a 37 y.o. male who comes to the emergency department via EMS with chest pain.  His pain began suddenly 2 hours ago is in his left upper chest searing aching moderate to severe and constant.  Seems to be somewhat worse when lying flat and somewhat improved when sitting up.  No shortness of breath.  He has no history of DVT or pulmonary embolism.  No history of coronary artery disease.  He is an intravenous methamphetamine abuser.  He last used methamphetamine around 9 PM last night roughly 16 hours ago.  He last used intravenous heroin about 2 months ago.  He was actually in our hospital 2 months ago intubated for several days for heroin overdose.  He says his tetanus is up-to-date.  Past Medical History:  Diagnosis Date  . H/o Lyme disease   . Heroin abuse (HCC)   . History of TIAs   . Hypertension     Patient Active Problem List   Diagnosis Date Noted  . Drug overdose 12/06/2017  . 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 Opiate abuse, episodic (HCC) 03/18/2017  . Substance induced mood disorder (HCC) 03/18/2017  . Self-inflicted laceration of wrist 03/18/2017    History reviewed. No pertinent surgical history.  Prior to Admission medications   Medication Sig Start Date End Date Taking? Authorizing Provider  diclofenac (VOLTAREN) 50 MG EC tablet Take 1 tablet (50 mg total) by mouth 2 (two) times daily. 02/21/18   Merrily Brittle, MD  naproxen (NAPROSYN) 500 MG tablet Take 1 tablet (500 mg total) by mouth 2 (two) times daily with a meal. Patient not taking: Reported on 03/18/2017 06/24/16   Kem Boroughs B, FNP  tiZANidine (ZANAFLEX) 4 MG tablet Take 1 tablet (4 mg  total) by mouth 3 (three) times daily. Patient not taking: Reported on 03/18/2017 06/24/16   Chinita Pester, FNP  traMADol (ULTRAM) 50 MG tablet Take 1 tablet (50 mg total) by mouth every 6 (six) hours as needed. Patient not taking: Reported on 12/06/2017 06/18/17 06/18/18  Willy Eddy, MD    Allergies Aspirin; Penicillins; and Tegretol [carbamazepine]  No family history on file.  Social History Social History   Tobacco Use  . Smoking status: Current Every Day Smoker    Packs/day: 1.00    Types: Cigars  . Smokeless tobacco: Never Used  Substance Use Topics  . Alcohol use: No  . Drug use: Yes    Types: IV    Comment: .5 bag heroine a day    Review of Systems Constitutional: No fever/chills Eyes: No visual changes. ENT: No sore throat. Cardiovascular: Positive for chest pain. Respiratory: Denies shortness of breath. Gastrointestinal: No abdominal pain.  No nausea, no vomiting.  No diarrhea.  No constipation. Genitourinary: Negative for dysuria. Musculoskeletal: Negative for back pain. Skin: Positive for wound Neurological: Negative for headaches, focal weakness or numbness.   ____________________________________________   PHYSICAL EXAM:  VITAL SIGNS: ED Triage Vitals  Enc Vitals Group     BP      Pulse      Resp      Temp      Temp src      SpO2  Weight      Height      Head Circumference      Peak Flow      Pain Score      Pain Loc      Pain Edu?      Excl. in GC?     Constitutional: Alert and oriented x4 appears somewhat uncomfortable nontoxic no diaphoresis speaks in full clear sentences Eyes: PERRL EOMI. Head: Atraumatic. Nose: No congestion/rhinnorhea. Mouth/Throat: No trismus Neck: No stridor.   Cardiovascular: Normal rate, regular rhythm. Grossly normal heart sounds.  Good peripheral circulation. Focally tender to the left upper anterior chest Respiratory: Somewhat increased respiratory effort.  No retractions. Lungs CTAB and moving  good air Gastrointestinal: Soft nontender Musculoskeletal: No lower extremity edema legs equal in size Neurologic:  Normal speech and language. No gross focal neurologic deficits are appreciated. Skin: Multiple old abrasions. Psychiatric: Mood and affect are normal. Speech and behavior are normal.    ____________________________________________   DIFFERENTIAL includes but not limited to  Acute coronary syndrome, endocarditis, myocarditis, pericarditis, pulmonary embolism, ____________________________________________   LABS (all labs ordered are listed, but only abnormal results are displayed)  Labs Reviewed  COMPREHENSIVE METABOLIC PANEL - Abnormal; Notable for the following components:      Result Value   Calcium 8.4 (*)    All other components within normal limits  ETHANOL  TROPONIN I  BRAIN NATRIURETIC PEPTIDE  CBC WITH DIFFERENTIAL/PLATELET  CK    Lab work reviewed by me with no acute disease __________________________________________  EKG  ED ECG REPORT I, Merrily Brittle, the attending physician, personally viewed and interpreted this ECG.  Date: 02/21/2018 EKG Time:  Rate: 90 Rhythm: normal sinus rhythm QRS Axis: normal Intervals: normal ST/T Wave abnormalities: normal Narrative Interpretation: no evidence of acute ischemia  ____________________________________________  RADIOLOGY  Chest x-ray reviewed by me with no acute disease ____________________________________________   PROCEDURES  Procedure(s) performed: no  Procedures  Critical Care performed: no  Observation: no ____________________________________________   INITIAL IMPRESSION / ASSESSMENT AND PLAN / ED COURSE  Pertinent labs & imaging results that were available during my care of the patient were reviewed by me and considered in my medical decision making (see chart for details).  The patient arrives with atypical chest pain.  I performed a bedside ultrasound it did not  appreciate any pericardial effusion.  Pain medication and lab work is pending.     ----------------------------------------- 7:28 PM on 02/21/2018 -----------------------------------------  Patient symptoms are significantly improved.  His EKG is nonischemic and his troponin is negative.  At this point I will treat him symptomatically with nonsteroidals and refer him back to primary care.  The patient's family verbalized understanding and agreement with plan. ____________________________________________   FINAL CLINICAL IMPRESSION(S) / ED DIAGNOSES  Final diagnoses:  Chest wall pain  Atypical chest pain  Methamphetamine abuse (HCC)      NEW MEDICATIONS STARTED DURING THIS VISIT:  Discharge Medication List as of 02/21/2018  7:28 PM    START taking these medications   Details  diclofenac (VOLTAREN) 50 MG EC tablet Take 1 tablet (50 mg total) by mouth 2 (two) times daily., Starting Sat 02/21/2018, Print         Note:  This document was prepared using Dragon voice recognition software and may include unintentional dictation errors.     Merrily Brittle, MD 02/24/18 2224

## 2018-02-21 NOTE — ED Notes (Signed)
MD notified that this RN and Marcelino Duster, RN were called to bedside due to patient's family concerns of patient having a seizure. This RN turned on lights, pt noted to be immediately responsive and able to answer questions, pt's family reports that "turning on the lights 'snapped him out of it'." This RN explained that lights typically did not have bearing on "snapping someone out of a seizure" but would notify MD. No new orders received, Marcelino Duster, RN also aware.

## 2018-02-21 NOTE — ED Triage Notes (Signed)
Pt arrived via ems from home with complaints of chest pain since today at 1330. Pt states its midsternum and radiates to left chest. Pt also reports tingling and numbness is both extremities. Pt reports using meth last night. Upon arrival pt in NAD

## 2018-02-21 NOTE — ED Notes (Signed)
Patient transported to X-ray 

## 2018-02-21 NOTE — Discharge Instructions (Signed)
It was a pleasure to take care of you today, and thank you for coming to our emergency department.  If you have any questions or concerns before leaving please ask the nurse to grab me and I'm more than happy to go through your aftercare instructions again.  If you were prescribed any opioid pain medication today such as Norco, Vicodin, Percocet, morphine, hydrocodone, or oxycodone please make sure you do not drive when you are taking this medication as it can alter your ability to drive safely.  If you have any concerns once you are home that you are not improving or are in fact getting worse before you can make it to your follow-up appointment, please do not hesitate to call 911 and come back for further evaluation.  Merrily Brittle, MD  Results for orders placed or performed during the hospital encounter of 02/21/18  Comprehensive metabolic panel  Result Value Ref Range   Sodium 138 135 - 145 mmol/L   Potassium 3.8 3.5 - 5.1 mmol/L   Chloride 107 101 - 111 mmol/L   CO2 24 22 - 32 mmol/L   Glucose, Bld 90 65 - 99 mg/dL   BUN 16 6 - 20 mg/dL   Creatinine, Ser 1.91 0.61 - 1.24 mg/dL   Calcium 8.4 (L) 8.9 - 10.3 mg/dL   Total Protein 7.1 6.5 - 8.1 g/dL   Albumin 4.2 3.5 - 5.0 g/dL   AST 36 15 - 41 U/L   ALT 54 17 - 63 U/L   Alkaline Phosphatase 68 38 - 126 U/L   Total Bilirubin 1.0 0.3 - 1.2 mg/dL   GFR calc non Af Amer >60 >60 mL/min   GFR calc Af Amer >60 >60 mL/min   Anion gap 7 5 - 15  Ethanol  Result Value Ref Range   Alcohol, Ethyl (B) <10 <10 mg/dL  Troponin I  Result Value Ref Range   Troponin I <0.03 <0.03 ng/mL  Brain natriuretic peptide  Result Value Ref Range   B Natriuretic Peptide 6.0 0.0 - 100.0 pg/mL  CBC with Differential  Result Value Ref Range   WBC 6.3 3.8 - 10.6 K/uL   RBC 5.21 4.40 - 5.90 MIL/uL   Hemoglobin 14.9 13.0 - 18.0 g/dL   HCT 47.8 29.5 - 62.1 %   MCV 86.1 80.0 - 100.0 fL   MCH 28.6 26.0 - 34.0 pg   MCHC 33.2 32.0 - 36.0 g/dL   RDW 30.8 65.7  - 84.6 %   Platelets 227 150 - 440 K/uL   Neutrophils Relative % 60 %   Neutro Abs 3.8 1.4 - 6.5 K/uL   Lymphocytes Relative 28 %   Lymphs Abs 1.7 1.0 - 3.6 K/uL   Monocytes Relative 10 %   Monocytes Absolute 0.6 0.2 - 1.0 K/uL   Eosinophils Relative 3 %   Eosinophils Absolute 0.2 0 - 0.7 K/uL   Basophils Relative 1 %   Basophils Absolute 0.0 0 - 0.1 K/uL  CK  Result Value Ref Range   Total CK 212 49 - 397 U/L   Dg Chest 2 View  Result Date: 02/21/2018 CLINICAL DATA:  Pt arrived via ems from home with complaints of chest pain since today at 1330. Pt states its midsternum and radiates to left chest. Pt also reports tingling and numbness is both extremities. Pt reports using meth last night. Hx HTN, TIA, heroin abuse. Current smoker. EXAM: CHEST - 2 VIEW COMPARISON:  12/07/2017 FINDINGS: The heart size and mediastinal contours  are within normal limits. Both lungs are clear. No pleural effusion or pneumothorax. The visualized skeletal structures are unremarkable. IMPRESSION: No active cardiopulmonary disease. Electronically Signed   By: Amie Portland M.D.   On: 02/21/2018 17:00

## 2018-02-21 NOTE — ED Notes (Signed)
Maralyn Sago, RN to bedside to check on patient and apologize and explain delay. Pt stated to Maralyn Sago that he wanted more pain medication ASAP, pt's family reports that his RR drops when he goes to sleep. MD made aware, no new orders received.

## 2018-04-08 ENCOUNTER — Inpatient Hospital Stay
Admission: EM | Admit: 2018-04-08 | Discharge: 2018-04-09 | DRG: 440 | Discharge status: Against Medical Advice | Payer: Self-pay | Attending: Family Medicine | Admitting: Family Medicine

## 2018-04-08 ENCOUNTER — Other Ambulatory Visit: Payer: Self-pay

## 2018-04-08 ENCOUNTER — Encounter: Payer: Self-pay | Admitting: Emergency Medicine

## 2018-04-08 ENCOUNTER — Emergency Department: Payer: Self-pay

## 2018-04-08 DIAGNOSIS — B179 Acute viral hepatitis, unspecified: Secondary | ICD-10-CM

## 2018-04-08 DIAGNOSIS — Z915 Personal history of self-harm: Secondary | ICD-10-CM

## 2018-04-08 DIAGNOSIS — F1910 Other psychoactive substance abuse, uncomplicated: Secondary | ICD-10-CM

## 2018-04-08 DIAGNOSIS — R748 Abnormal levels of other serum enzymes: Secondary | ICD-10-CM

## 2018-04-08 DIAGNOSIS — Z880 Allergy status to penicillin: Secondary | ICD-10-CM

## 2018-04-08 DIAGNOSIS — R161 Splenomegaly, not elsewhere classified: Secondary | ICD-10-CM | POA: Diagnosis present

## 2018-04-08 DIAGNOSIS — F17290 Nicotine dependence, other tobacco product, uncomplicated: Secondary | ICD-10-CM | POA: Diagnosis present

## 2018-04-08 DIAGNOSIS — R945 Abnormal results of liver function studies: Secondary | ICD-10-CM

## 2018-04-08 DIAGNOSIS — R109 Unspecified abdominal pain: Secondary | ICD-10-CM

## 2018-04-08 DIAGNOSIS — Z8673 Personal history of transient ischemic attack (TIA), and cerebral infarction without residual deficits: Secondary | ICD-10-CM

## 2018-04-08 DIAGNOSIS — I10 Essential (primary) hypertension: Secondary | ICD-10-CM | POA: Diagnosis present

## 2018-04-08 DIAGNOSIS — K859 Acute pancreatitis without necrosis or infection, unspecified: Principal | ICD-10-CM | POA: Diagnosis present

## 2018-04-08 DIAGNOSIS — F1510 Other stimulant abuse, uncomplicated: Secondary | ICD-10-CM | POA: Diagnosis present

## 2018-04-08 DIAGNOSIS — R7989 Other specified abnormal findings of blood chemistry: Secondary | ICD-10-CM

## 2018-04-08 DIAGNOSIS — Z886 Allergy status to analgesic agent status: Secondary | ICD-10-CM

## 2018-04-08 DIAGNOSIS — Z888 Allergy status to other drugs, medicaments and biological substances status: Secondary | ICD-10-CM

## 2018-04-08 HISTORY — DX: Other stimulant abuse, uncomplicated: F15.10

## 2018-04-08 LAB — COMPREHENSIVE METABOLIC PANEL
ALBUMIN: 3.6 g/dL (ref 3.5–5.0)
ALT: 605 U/L — ABNORMAL HIGH (ref 17–63)
AST: 377 U/L — AB (ref 15–41)
Alkaline Phosphatase: 144 U/L — ABNORMAL HIGH (ref 38–126)
Anion gap: 10 (ref 5–15)
BUN: 16 mg/dL (ref 6–20)
CHLORIDE: 101 mmol/L (ref 101–111)
CO2: 24 mmol/L (ref 22–32)
Calcium: 8.7 mg/dL — ABNORMAL LOW (ref 8.9–10.3)
Creatinine, Ser: 0.86 mg/dL (ref 0.61–1.24)
GFR calc Af Amer: >60 mL/min (ref >60)
GFR calc non Af Amer: >60 mL/min (ref >60)
GLUCOSE: 222 mg/dL — AB (ref 65–99)
POTASSIUM: 3.9 mmol/L (ref 3.5–5.1)
Sodium: 135 mmol/L (ref 135–145)
Total Bilirubin: 0.9 mg/dL (ref 0.3–1.2)
Total Protein: 7.2 g/dL (ref 6.5–8.1)

## 2018-04-08 LAB — URINALYSIS, COMPLETE (UACMP) WITH MICROSCOPIC
Bacteria, UA: NONE SEEN
Bilirubin Urine: NEGATIVE
Glucose, UA: 150 mg/dL — AB
HGB URINE DIPSTICK: NEGATIVE
Ketones, ur: NEGATIVE mg/dL
Nitrite: NEGATIVE
PROTEIN: NEGATIVE mg/dL
Specific Gravity, Urine: 1.019 (ref 1.005–1.030)
pH: 6 (ref 5.0–8.0)

## 2018-04-08 LAB — CBC WITH DIFFERENTIAL/PLATELET
BASOS ABS: 0.1 10*3/uL (ref 0–0.1)
Basophils Relative: 1 %
EOS PCT: 5 %
Eosinophils Absolute: 0.5 10*3/uL (ref 0–0.7)
HCT: 49.6 % (ref 40.0–52.0)
Hemoglobin: 16.9 g/dL (ref 13.0–18.0)
LYMPHS PCT: 22 %
Lymphs Abs: 2 10*3/uL (ref 1.0–3.6)
MCH: 30.1 pg (ref 26.0–34.0)
MCHC: 34.2 g/dL (ref 32.0–36.0)
MCV: 88.1 fL (ref 80.0–100.0)
Monocytes Absolute: 0.6 10*3/uL (ref 0.2–1.0)
Monocytes Relative: 7 %
Neutro Abs: 5.9 10*3/uL (ref 1.4–6.5)
Neutrophils Relative %: 65 %
PLATELETS: 240 10*3/uL (ref 150–440)
RBC: 5.63 MIL/uL (ref 4.40–5.90)
RDW: 16.4 % — ABNORMAL HIGH (ref 11.5–14.5)
WBC: 9 10*3/uL (ref 3.8–10.6)

## 2018-04-08 NOTE — ED Notes (Signed)
Patient transported to CT 

## 2018-04-08 NOTE — ED Triage Notes (Signed)
Patient ambulatory to triage with steady gait, without difficulty or distress noted; pt reports x 3 wks having right flank pain radiating into right groin accomp by dark urine; st hx kidney stones

## 2018-04-08 NOTE — ED Notes (Signed)
Pt reports blood in urine as well as RLQ, flank and groin pain. Pt states hx of kidney stones. Pt states he "quit meth 8 days ago." Pt denies alcohol, states "sometimes my liver gets inflammed." Pt ambulatory without diff.

## 2018-04-09 ENCOUNTER — Other Ambulatory Visit: Payer: Self-pay

## 2018-04-09 ENCOUNTER — Encounter: Payer: Self-pay | Admitting: Emergency Medicine

## 2018-04-09 DIAGNOSIS — K859 Acute pancreatitis without necrosis or infection, unspecified: Secondary | ICD-10-CM | POA: Diagnosis present

## 2018-04-09 DIAGNOSIS — R945 Abnormal results of liver function studies: Secondary | ICD-10-CM

## 2018-04-09 DIAGNOSIS — R7989 Other specified abnormal findings of blood chemistry: Secondary | ICD-10-CM

## 2018-04-09 DIAGNOSIS — B179 Acute viral hepatitis, unspecified: Secondary | ICD-10-CM

## 2018-04-09 DIAGNOSIS — R748 Abnormal levels of other serum enzymes: Secondary | ICD-10-CM

## 2018-04-09 DIAGNOSIS — F1910 Other psychoactive substance abuse, uncomplicated: Secondary | ICD-10-CM

## 2018-04-09 LAB — URINE DRUG SCREEN, QUALITATIVE (ARMC ONLY)
Amphetamines, Ur Screen: NOT DETECTED
BENZODIAZEPINE, UR SCRN: NOT DETECTED
Cannabinoid 50 Ng, Ur ~~LOC~~: NOT DETECTED
Cocaine Metabolite,Ur ~~LOC~~: NOT DETECTED
MDMA (Ecstasy)Ur Screen: NOT DETECTED
METHADONE SCREEN, URINE: NOT DETECTED
Opiate, Ur Screen: POSITIVE — AB
Phencyclidine (PCP) Ur S: NOT DETECTED
TRICYCLIC, UR SCREEN: NOT DETECTED

## 2018-04-09 LAB — ACETAMINOPHEN LEVEL

## 2018-04-09 LAB — HEMOGLOBIN A1C
Hgb A1c MFr Bld: 5.5 % (ref 4.8–5.6)
Mean Plasma Glucose: 111.15 mg/dL

## 2018-04-09 LAB — LIPASE, BLOOD
LIPASE: 293 U/L — AB (ref 11–51)
LIPASE: 55 U/L — AB (ref 11–51)

## 2018-04-09 LAB — TSH: TSH: 0.794 u[IU]/mL (ref 0.350–4.500)

## 2018-04-09 MED ORDER — ONDANSETRON HCL 4 MG/2ML IJ SOLN
4.0000 mg | Freq: Four times a day (QID) | INTRAMUSCULAR | Status: DC | PRN
Start: 2018-04-09 — End: 2018-04-09

## 2018-04-09 MED ORDER — KETOROLAC TROMETHAMINE 30 MG/ML IJ SOLN
30.0000 mg | Freq: Once | INTRAMUSCULAR | Status: AC
  Administered 2018-04-09: 30 mg via INTRAMUSCULAR
  Filled 2018-04-09: qty 1

## 2018-04-09 MED ORDER — ENOXAPARIN SODIUM 40 MG/0.4ML ~~LOC~~ SOLN
40.0000 mg | SUBCUTANEOUS | Status: DC
  Administered 2018-04-09: 40 mg via SUBCUTANEOUS
  Filled 2018-04-09: qty 0.4

## 2018-04-09 MED ORDER — MORPHINE SULFATE (PF) 2 MG/ML IV SOLN
2.0000 mg | INTRAVENOUS | Status: DC | PRN
  Administered 2018-04-09: 2 mg via INTRAVENOUS
  Administered 2018-04-09 (×3): 4 mg via INTRAVENOUS
  Filled 2018-04-09 (×4): qty 2

## 2018-04-09 MED ORDER — ONDANSETRON HCL 4 MG PO TABS: 4.0000 mg | ORAL_TABLET | Freq: Four times a day (QID) | ORAL | Status: DC | PRN

## 2018-04-09 MED ORDER — DOCUSATE SODIUM 100 MG PO CAPS: 100.0000 mg | ORAL_CAPSULE | Freq: Two times a day (BID) | ORAL | Status: DC

## 2018-04-09 MED ORDER — SODIUM CHLORIDE 0.9 % IV SOLN
INTRAVENOUS | Status: DC
  Administered 2018-04-09 (×2): via INTRAVENOUS

## 2018-04-09 NOTE — Progress Notes (Signed)
Patient's RN called and notified that patient is leaving AGAINST MEDICAL ADVICE

## 2018-04-09 NOTE — ED Provider Notes (Addendum)
[facility] Emergency Department Provider Note  ____________________________________________   First MD Initiated Contact with Patient 04/08/18 2334     (approximate)  I have reviewed the triage vital signs and the nursing notes.   HISTORY  Chief Complaint Flank Pain    HPI Isaiah Green. is a 37 y.o. male with an extensive history of polysubstance abuse but he denies alcohol use.  He presents for evaluation of pain for several months primarily in his right flank but also the left flank and radiating around to the front.  He says that sometimes he sees blood in his urine.  He reports being "busy" with his substance abuse issues but he says he has been clean from heroin and methamphetamine for about 8 days and he thinks that is why he is feeling everything more than usual.  He says he has not been able to get any rest in the last couple of days due to the pain in his back.   Any amount of movement makes his symptoms worse, holding still makes a little bit better.  He thinks he might have been passing stones intermittently.  He denies dysuria.  He denies fever/chills, chest pain or shortness of breath, nausea, vomiting.  He does not go to a doctor.  He denies any alcohol use.  He denies recent Tylenol use.  He does admit to IV drug use and has numerous subacute and chronic injection sites on his arms.   Past Medical History:  Diagnosis Date  . H/o Lyme disease   . Heroin abuse (HCC)   . History of TIAs   . Hypertension   . 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 Pancreatitis 04/09/2018  . Drug overdose 12/06/2017  . 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 Opiate abuse, episodic (HCC) 03/18/2017  . Substance induced mood disorder (HCC) 03/18/2017  . Self-inflicted laceration of wrist 03/18/2017    History reviewed. No pertinent surgical history.  Prior to Admission medications   Medication Sig Start Date  End Date Taking? Authorizing Provider  diclofenac (VOLTAREN) 50 MG EC tablet Take 1 tablet (50 mg total) by mouth 2 (two) times daily. Patient not taking: Reported on 04/09/2018 02/21/18   Merrily Brittle, MD  naproxen (NAPROSYN) 500 MG tablet Take 1 tablet (500 mg total) by mouth 2 (two) times daily with a meal. Patient not taking: Reported on 03/18/2017 06/24/16   Kem Boroughs B, FNP  tiZANidine (ZANAFLEX) 4 MG tablet Take 1 tablet (4 mg total) by mouth 3 (three) times daily. Patient not taking: Reported on 03/18/2017 06/24/16   Chinita Pester, FNP  traMADol (ULTRAM) 50 MG tablet Take 1 tablet (50 mg total) by mouth every 6 (six) hours as needed. Patient not taking: Reported on 12/06/2017 06/18/17 06/18/18  Willy Eddy, MD    Allergies Aspirin; Penicillins; and Tegretol [carbamazepine]  History reviewed. No pertinent family history.  Social History Social History   Tobacco Use  . Smoking status: Current Every Day Smoker    Packs/day: 1.00    Types: Cigars  . Smokeless tobacco: Never Used  Substance Use Topics  . Alcohol use: No  . Drug use: Yes    Types: IV    Comment: heroin, meth, cocaine    Review of Systems Constitutional: No fever/chills Eyes: No visual changes. ENT: No sore throat. Cardiovascular: Denies chest pain. Respiratory: Denies shortness of breath. Gastrointestinal: Bilateral flank and lower abdominal pain  worse on the right that is been present for several months but has been more acute for the last few days.  Denies nausea, vomiting, and diarrhea Genitourinary: Negative for dysuria. Musculoskeletal: Negative for neck pain.  Bilateral flank pain worse on the right. Integumentary: Negative for rash. Neurological: Negative for headaches, focal weakness or numbness.   ____________________________________________   PHYSICAL EXAM:  VITAL SIGNS: ED Triage Vitals  Enc Vitals Group     BP 04/08/18 2145 (!) 156/91     Pulse Rate 04/08/18 2145 86     Resp  --      Temp 04/08/18 2145 97.7 F (36.5 C)     Temp Source 04/08/18 2145 Oral     SpO2 04/08/18 2145 99 %     Weight 04/08/18 2144 95.3 kg (210 lb)     Height 04/08/18 2144 1.829 m (6')     Head Circumference --      Peak Flow --      Pain Score 04/08/18 2144 9     Pain Loc --      Pain Edu? --      Excl. in GC? --     Constitutional: Alert and oriented.  Generally well-appearing, does not appear to be in acute distress Eyes: Conjunctivae are normal.  No scleral icterus. Head: Atraumatic. Nose: No congestion/rhinnorhea. Neck: No stridor.  No meningeal signs.   Cardiovascular: Normal rate, regular rhythm. Good peripheral circulation. Grossly normal heart sounds. Respiratory: Normal respiratory effort.  No retractions. Lungs CTAB. Gastrointestinal: Soft with mild tenderness to palpation on the right side of his abdomen but no tenderness at McBurney's point.  No rebound and no guarding.  No distention or evidence of ascites. Musculoskeletal: No lower extremity tenderness nor edema. No gross deformities of extremities. Neurologic:  Normal speech and language. No gross focal neurologic deficits are appreciated.  Skin:  Skin is warm, dry and intact, but he has numerous scratches and vena puncture marks on both of his arms from his drug use.  No sign of acute infection. Psychiatric: Mood and affect are normal. Speech and behavior are normal.  ____________________________________________   LABS (all labs ordered are listed, but only abnormal results are displayed)  Labs Reviewed  COMPREHENSIVE METABOLIC PANEL - Abnormal; Notable for the following components:      Result Value   Glucose, Bld 222 (*)    Calcium 8.7 (*)    AST 377 (*)    ALT 605 (*)    Alkaline Phosphatase 144 (*)    All other components within normal limits  URINALYSIS, COMPLETE (UACMP) WITH MICROSCOPIC - Abnormal; Notable for the following components:   Color, Urine YELLOW (*)    APPearance CLEAR (*)    Glucose,  UA 150 (*)    Leukocytes, UA TRACE (*)    All other components within normal limits  CBC WITH DIFFERENTIAL/PLATELET - Abnormal; Notable for the following components:   RDW 16.4 (*)    All other components within normal limits  LIPASE, BLOOD - Abnormal; Notable for the following components:   Lipase 293 (*)    All other components within normal limits  ACETAMINOPHEN LEVEL - Abnormal; Notable for the following components:   Acetaminophen (Tylenol), Serum <10 (*)    All other components within normal limits  LIPASE, BLOOD - Abnormal; Notable for the following components:   Lipase 55 (*)    All other components within normal limits  HEPATITIS PANEL, ACUTE  TSH  HEMOGLOBIN A1C   ____________________________________________  EKG  No indication for EKG ____________________________________________  RADIOLOGY   ED MD interpretation: No acute hepatic or biliary issues are identified.  Patient has some intrarenal stones but no evidence of ureteral stones.  No other acute issues identified.  Official radiology report(s): Ct Renal Stone Study  Result Date: 04/09/2018 CLINICAL DATA:  Right flank pain and hematuria. History kidney stones. EXAM: CT ABDOMEN AND PELVIS WITHOUT CONTRAST TECHNIQUE: Multidetector CT imaging of the abdomen and pelvis was performed following the standard protocol without IV contrast. COMPARISON:  CT 03/21/2017 FINDINGS: Lower chest: The lung bases are clear. Hepatobiliary: Mild decreased hepatic density consistent with steatosis. No discrete focal lesion on noncontrast exam. Gallbladder physiologically distended, no calcified stone. No biliary dilatation. Pancreas: No ductal dilatation or inflammation. Spleen: Prominent size spanning 14.2 cm cranial caudal. Adrenals/Urinary Tract: Normal adrenal glands. No hydronephrosis or perinephric edema. Faint tiny stones in both renal collecting systems. Ureters are decompressed without stone along the course. Urinary bladder is  partially distended without stone. No bladder wall thickening. Stomach/Bowel: Stomach is distended with ingested contents. No bowel wall thickening, inflammatory change or obstruction. Normal appendix. Moderate stool burden throughout the colon. No colonic inflammation. Single colonic diverticula in the sigmoid colon. Vascular/Lymphatic: Mild but age advanced aorta bi-iliac atherosclerosis. Reactive appearing lymph nodes in the porta hepatis and portacaval stations. No enlarged lymph nodes in the abdomen or pelvis. Reproductive: Prostate is unremarkable. Other: No free air, free fluid, or intra-abdominal fluid collection. Minimal fat in both inguinal canals. Musculoskeletal: There are no acute or suspicious osseous abnormalities. IMPRESSION: 1. Punctate nonobstructing stones in both kidneys. No obstructive uropathy or ureteral calculi. 2. No acute findings in the abdomen/pelvis. 3. Incidental mild hepatic steatosis and Aortic Atherosclerosis (ICD10-I70.0). Electronically Signed   By: Rubye OaksMelanie  Ehinger M.D.   On: 04/09/2018 00:06    ____________________________________________   PROCEDURES  Critical Care performed: No   Procedure(s) performed:   Procedures   ____________________________________________   INITIAL IMPRESSION / ASSESSMENT AND PLAN / ED COURSE  As part of my medical decision making, I reviewed the following data within the electronic MEDICAL RECORD NUMBER Nursing notes reviewed and incorporated, Labs reviewed , EKG interpreted , Old chart reviewed, Discussed with admitting physician  and Notes from prior ED visits    Differential diagnosis includes, but is not limited to, kidney stone/renal colic, pancreatitis, biliary disease, acute hepatitis, acetaminophen overdose, other acute intra-abdominal infection.  The patient has normal and stable vital signs, no tachycardia, no fever.  CBC is normal with no leukocytosis and no anemia.  His conference of metabolic panel is notable for  elevated AST, ALT, and alkaline phosphatase with no elevated bilirubin.  His urinalysis is normal except for some glucosuria but he has no evidence of hematuria.  Given his history of IV drug use I suspect that he does have infectious hepatitis but there is no evidence of sepsis and no indication for antibiotics at this time.  I have added on a lipase level, acetaminophen level, and acute hepatitis panel.  I updated the patient about the results of his CT scan and his labs.  I explained to him that with his drug abuse history I simply cannot give him opioids, but I am giving him Toradol 30 mg intramuscular and encouraged him to use over-the-counter NSAIDs with meals.   Given the significant LFT elevation and strong probability of acute hepatitis as well as his ongoing abdominal pain, I discussed the case with Dr. Anne HahnWillis the hospitalist by phone and he will  admit for GI consultation tomorrow.  I have dated the patient and his family.  Clinical Course as of Apr 09 310  Thu Apr 09, 2018  0104 Minimally elevated lipase, not clinically consistent with pancreatitis, and patient has no upper abdominal tenderness to palpation.  Lipase(!): 55 [CF]  0104 Patient   [CF]  0116 Acetaminophen level(!) [CF]  0239 Lipase(!): 293 [CF]    Clinical Course User Index [CF] Loleta Rose, MD    ____________________________________________  FINAL CLINICAL IMPRESSION(S) / ED DIAGNOSES  Final diagnoses:  Acute hepatitis  Right sided abdominal pain  Polysubstance abuse (HCC)  Elevated LFTs  Elevated lipase     MEDICATIONS GIVEN DURING THIS VISIT:  Medications  enoxaparin (LOVENOX) injection 40 mg (has no administration in time range)  0.9 %  sodium chloride infusion (has no administration in time range)  docusate sodium (COLACE) capsule 100 mg (has no administration in time range)  ondansetron (ZOFRAN) tablet 4 mg (has no administration in time range)    Or  ondansetron (ZOFRAN) injection 4 mg (has no  administration in time range)  morphine 2 MG/ML injection 2-4 mg (has no administration in time range)  ketorolac (TORADOL) 30 MG/ML injection 30 mg (30 mg Intramuscular Given 04/09/18 0048)     ED Discharge Orders    None       Note:  This document was prepared using Dragon voice recognition software and may include unintentional dictation errors.    Loleta Rose, MD 04/09/18 Ollen Barges    Loleta Rose, MD 04/09/18 [phone]

## 2018-04-09 NOTE — Progress Notes (Signed)
Patient leaving against medical advice.  Encouraged patient to stay with no avail.  Patient was not happy with being NPO for pancreatitis.  Dr. Amado CoeGouru notified of departure and nursing supervisor informed.  AMA papers were signed and iv removed, catheter intact.  Isaiah Green, Teshara Moree N  04/09/2018  8:19 PM

## 2018-04-09 NOTE — Consult Note (Signed)
Surgical Consultation  04/09/2018  Dash Cardarelli. is an 37 y.o. male.   Referring Physician: Dr. Jerelyn Charles  CC: Abdominal pain  HPI: This a patient with 2 months of abdominal pain that has worsened over the last few days.  He stopped his IV drug abuse which consisted of methamphetamine as his preferred drug and started noticing worsening of his pain when he stopped.  He has not used drugs for 8 days.  He is never had an episode like this before the last 2 months.  He has had hematuria and has known kidney stones he has hematuria now.  When asked about the location of his pain he points to his right upper quadrant and right back very minimal on the left side more so in the middle of his back.  He states that he only has 3 bowel movements a month and may relate that to his IV drug abuse.  It has been fairly normal for him for several years.  He has never had a colonoscopy.  He is unemployed and does not drink alcohol but used to be a heavy drinker currently stopped taking his methamphetamine by IV route 8 days ago.  He smokes tobacco.  Family history of significant medical problems.  Past Medical History:  Diagnosis Date  . H/o Lyme disease   . Heroin abuse ([place])   . History of TIAs   . Hypertension   . Methamphetamine abuse ([place])     History reviewed. No pertinent surgical history.  Family History  Problem Relation Age of Onset  . Diabetes Mellitus II Mother   . Diabetes Mellitus II Sister   . Hypertension Sister     Social History:  reports that he has been smoking cigars.  He has been smoking about 1.00 pack per day. He has never used smokeless tobacco. He reports that he has current or past drug history. Drug: IV. He reports that he does not drink alcohol.  Allergies:  Allergies  Allergen Reactions  . Aspirin   . Penicillins Swelling    Has patient had a PCN reaction causing immediate rash, facial/tongue/throat swelling, SOB or lightheadedness with hypotension: yes, throat  swelling Has patient had a PCN reaction causing severe rash involving mucus membranes or skin necrosis: no Has patient had a PCN reaction that required hospitalization: no Has patient had a PCN reaction occurring within the last 10 years: no If all of the above answers are "NO", then may proceed with Cephalosporin use.   . Tegretol [Carbamazepine]     Medications reviewed.   Review of Systems:   Review of Systems  Constitutional: Negative.   HENT: Negative.   Eyes: Negative.   Respiratory: Negative.   Cardiovascular: Negative.   Gastrointestinal: Positive for abdominal pain and constipation. Negative for blood in stool, diarrhea, heartburn, melena, nausea and vomiting.  Genitourinary: Negative.   Musculoskeletal: Negative.   Skin: Negative.   Neurological: Negative.   Endo/Heme/Allergies: Negative.   Psychiatric/Behavioral: Positive for substance abuse.     Physical Exam:  BP (!) 146/96 (BP Location: Right Arm)   Pulse 67   Temp 97.7 F (36.5 C) (Oral)   Resp 18   Ht 6' (1.829 m)   Wt 210 lb (95.3 kg)   SpO2 97%   BMI 28.48 kg/m   Physical Exam  Constitutional: He is oriented to person, place, and time. He appears well-developed and well-nourished. No distress.  Extensive tattooing over his entire body.  No acute distress  HENT:  Head: Normocephalic and atraumatic.  Eyes: Pupils are equal, round, and reactive to light. EOM are normal. Right eye exhibits no discharge. Left eye exhibits no discharge. No scleral icterus.  Neck: Normal range of motion. Neck supple.  Cardiovascular: Normal rate, regular rhythm and normal heart sounds.  Pulmonary/Chest: Effort normal and breath sounds normal. No stridor. No respiratory distress.  Abdominal: Soft. He exhibits no distension and no mass. There is tenderness. There is no rebound and no guarding.  Minimal tenderness in the right upper quadrant if any.  Negative Murphy sign.  No left upper quadrant tenderness.  The spleen is  not palpable on my exam.  Multiple tattoos but no scars.  No CVA tenderness.  Musculoskeletal: Normal range of motion. He exhibits no edema or deformity.  Neurological: He is alert and oriented to person, place, and time.  Skin: Skin is warm and dry. He is not diaphoretic. No erythema.  Extensive tattooing  Vitals reviewed.     Results for orders placed or performed during the hospital encounter of 04/08/18 (from the past 48 hour(s))  Comprehensive metabolic panel     Status: Abnormal   Collection Time: 04/08/18  9:47 PM  Result Value Ref Range   Sodium 135 135 - 145 mmol/L   Potassium 3.9 3.5 - 5.1 mmol/L    Comment: HEMOLYSIS AT THIS LEVEL MAY AFFECT RESULT   Chloride 101 101 - 111 mmol/L   CO2 24 22 - 32 mmol/L   Glucose, Bld 222 (H) 65 - 99 mg/dL   BUN 16 6 - 20 mg/dL   Creatinine, Ser 0.86 0.61 - 1.24 mg/dL   Calcium 8.7 (L) 8.9 - 10.3 mg/dL   Total Protein 7.2 6.5 - 8.1 g/dL   Albumin 3.6 3.5 - 5.0 g/dL   AST 377 (H) 15 - 41 U/L   ALT 605 (H) 17 - 63 U/L   Alkaline Phosphatase 144 (H) 38 - 126 U/L   Total Bilirubin 0.9 0.3 - 1.2 mg/dL   GFR calc non Af Amer >60 >60 mL/min   GFR calc Af Amer >60 >60 mL/min    Comment: (NOTE) The eGFR has been calculated using the CKD EPI equation. This calculation has not been validated in all clinical situations. eGFR's persistently <60 mL/min signify possible Chronic Kidney Disease.    Anion gap 10 5 - 15    Comment: Performed at [facility], [address] [place], [place] [address]  Urinalysis, Complete w Microscopic     Status: Abnormal   Collection Time: 04/08/18  9:47 PM  Result Value Ref Range   Color, Urine YELLOW (A) YELLOW   APPearance CLEAR (A) CLEAR   Specific Gravity, Urine 1.019 1.005 - 1.030   pH 6.0 5.0 - 8.0   Glucose, UA 150 (A) NEGATIVE mg/dL   Hgb urine dipstick NEGATIVE NEGATIVE   Bilirubin Urine NEGATIVE NEGATIVE   Ketones, ur NEGATIVE NEGATIVE mg/dL   Protein, ur NEGATIVE NEGATIVE mg/dL    Nitrite NEGATIVE NEGATIVE   Leukocytes, UA TRACE (A) NEGATIVE   RBC / HPF 0-5 0 - 5 RBC/hpf   WBC, UA 0-5 0 - 5 WBC/hpf   Bacteria, UA NONE SEEN NONE SEEN   Squamous Epithelial / LPF 0-5 0 - 5    Comment: Performed at [facility], [address] [place], [place] [address]  CBC with Differential     Status: Abnormal   Collection Time: 04/08/18  9:47 PM  Result Value Ref Range   WBC 9.0  3.8 - 10.6 K/uL   RBC 5.63 4.40 - 5.90 MIL/uL   Hemoglobin 16.9 13.0 - 18.0 g/dL   HCT 49.6 40.0 - 52.0 %   MCV 88.1 80.0 - 100.0 fL   MCH 30.1 26.0 - 34.0 pg   MCHC 34.2 32.0 - 36.0 g/dL   RDW 16.4 (H) 11.5 - 14.5 %   Platelets 240 150 - 440 K/uL   Neutrophils Relative % 65 %   Neutro Abs 5.9 1.4 - 6.5 K/uL   Lymphocytes Relative 22 %   Lymphs Abs 2.0 1.0 - 3.6 K/uL   Monocytes Relative 7 %   Monocytes Absolute 0.6 0.2 - 1.0 K/uL   Eosinophils Relative 5 %   Eosinophils Absolute 0.5 0 - 0.7 K/uL   Basophils Relative 1 %   Basophils Absolute 0.1 0 - 0.1 K/uL    Comment: Performed at [facility], [address] [place], [place] [address]  Lipase, blood     Status: Abnormal   Collection Time: 04/08/18  9:47 PM  Result Value Ref Range   Lipase 55 (H) 11 - 51 U/L    Comment: Performed at [facility], [address] [place], [place] [address]  TSH     Status: None   Collection Time: 04/08/18  9:47 PM  Result Value Ref Range   TSH 0.794 0.350 - 4.500 uIU/mL    Comment: Performed by a 3rd Generation assay with a functional sensitivity of <=0.01 uIU/mL. Performed at [facility], [address] [place], [place] [address]   Hemoglobin A1c     Status: None   Collection Time: 04/08/18  9:47 PM  Result Value Ref Range   Hgb A1c MFr Bld 5.5 4.8 - 5.6 %    Comment: (NOTE) Pre diabetes:          5.7%-6.4% Diabetes:              >6.4% Glycemic control for   <7.0% adults with diabetes    Mean Plasma Glucose 111.15 mg/dL    Comment: Performed at [facility] [address] [place], [place] [address]  Lipase, blood     Status: Abnormal   Collection Time: 04/09/18 12:42 AM  Result Value Ref Range   Lipase 293 (H) 11 - 51 U/L    Comment: Performed at [facility], [place], [place] [address]  Acetaminophen level     Status: Abnormal   Collection Time: 04/09/18 12:42 AM  Result Value Ref Range   Acetaminophen (Tylenol), Serum <10 (L) 10 - 30 ug/mL    Comment: (NOTE) Therapeutic concentrations vary significantly. A range of 10-30 ug/mL  may be an effective concentration for many patients. However, some  are best treated at concentrations outside of this range. Acetaminophen concentrations >150 ug/mL at 4 hours after ingestion  and >50 ug/mL at 12 hours after ingestion are often associated with  toxic reactions. Performed at [facility], [address] [place], [place] [address]    Ct Renal Joaquim Lai Study  Result Date: 04/09/2018 CLINICAL DATA:  Right flank pain and hematuria. History kidney stones. EXAM: CT ABDOMEN AND PELVIS WITHOUT CONTRAST TECHNIQUE: Multidetector CT imaging of the abdomen and pelvis was performed following the standard protocol without IV contrast. COMPARISON:  CT 03/21/2017 FINDINGS: Lower chest: The lung bases are clear. Hepatobiliary: Mild decreased hepatic density consistent with steatosis. No discrete focal lesion on noncontrast exam. Gallbladder physiologically distended, no calcified stone. No biliary dilatation.  Pancreas: No ductal dilatation or inflammation. Spleen: Prominent size spanning 14.2 cm cranial caudal. Adrenals/Urinary Tract: Normal adrenal glands. No hydronephrosis or perinephric edema. Faint tiny stones in both renal collecting systems. Ureters are decompressed without stone along the course. Urinary bladder is partially distended without stone. No bladder wall thickening. Stomach/Bowel: Stomach is distended with ingested contents. No bowel wall thickening,  inflammatory change or obstruction. Normal appendix. Moderate stool burden throughout the colon. No colonic inflammation. Single colonic diverticula in the sigmoid colon. Vascular/Lymphatic: Mild but age advanced aorta bi-iliac atherosclerosis. Reactive appearing lymph nodes in the porta hepatis and portacaval stations. No enlarged lymph nodes in the abdomen or pelvis. Reproductive: Prostate is unremarkable. Other: No free air, free fluid, or intra-abdominal fluid collection. Minimal fat in both inguinal canals. Musculoskeletal: There are no acute or suspicious osseous abnormalities. IMPRESSION: 1. Punctate nonobstructing stones in both kidneys. No obstructive uropathy or ureteral calculi. 2. No acute findings in the abdomen/pelvis. 3. Incidental mild hepatic steatosis and Aortic Atherosclerosis (ICD10-I70.0). Electronically Signed   By: Jeb Levering M.D.   On: 04/09/2018 00:06    Assessment/Plan:  CT scans personally reviewed.  Labs are personally reviewed.  I was asked see the patient for "splenomegaly".  The CT scan shows a slightly enlarged but essentially normal-appearing spleen at 14 cm maximal dimension.  I cannot feel the spleen on my exam although the admitting internist stated that he could feel it.  He has no sign of thrombocytopenia.  My concern in a patient with IV drug abuse and right upper quadrant pain with no stones on ultrasound CT scan would be that this is likely transmissible agent related such as hepatitis.  He does have some pancreatitis which could be drug related.  I would not have any plans for surgical intervention in this patient without gallstones but would continue to observe until the etiology of his abdominal pain is delineated.  Discussed with him and Dr. Jerelyn Charles.  Continue to follow.  Florene Glen, MD, FACS

## 2018-04-09 NOTE — H&P (Signed)
Isaiah Green. is an 37 y.o. male.   Chief Complaint: Abdominal pain HPI: The patient with past medical history of methamphetamine and heroin abuse and hypertension presents to the emergency department with right upper quadrant abdominal pain.  The patient denies nausea or vomiting.  He states the pain has been intermittent over several months but has acutely worsened in the last 24 hours.  Laboratory evaluation revealed transaminitis as well as elevated lipase.  CT of his abdomen showed mild steatosis with physiologically distended gallbladder but no stones.  The patient's spleen was prominent in size as well.  Due to ongoing pain as well as hepatitis and pancreatitis emergency department staff called the hospitalist service for admission.  Past Medical History:  Diagnosis Date  . H/o Lyme disease   . Heroin abuse ([place])   . History of TIAs   . Hypertension   . Methamphetamine abuse ([place])     History reviewed. No pertinent surgical history. None  Family History  Problem Relation Age of Onset  . Diabetes Mellitus II Mother   . Diabetes Mellitus II Sister   . Hypertension Sister    Social History:  reports that he has been smoking cigars.  He has been smoking about 1.00 pack per day. He has never used smokeless tobacco. He reports that he has current or past drug history. Drug: IV. He reports that he does not drink alcohol.  Allergies:  Allergies  Allergen Reactions  . Aspirin   . Penicillins Swelling    Has patient had a PCN reaction causing immediate rash, facial/tongue/throat swelling, SOB or lightheadedness with hypotension: yes, throat swelling Has patient had a PCN reaction causing severe rash involving mucus membranes or skin necrosis: no Has patient had a PCN reaction that required hospitalization: no Has patient had a PCN reaction occurring within the last 10 years: no If all of the above answers are "NO", then may proceed with Cephalosporin use.   . Tegretol  [Carbamazepine]     Medications Prior to Admission  Medication Sig Dispense Refill  . diclofenac (VOLTAREN) 50 MG EC tablet Take 1 tablet (50 mg total) by mouth 2 (two) times daily. (Patient not taking: Reported on 04/09/2018) 60 tablet 0  . naproxen (NAPROSYN) 500 MG tablet Take 1 tablet (500 mg total) by mouth 2 (two) times daily with a meal. (Patient not taking: Reported on 03/18/2017) 30 tablet 0  . tiZANidine (ZANAFLEX) 4 MG tablet Take 1 tablet (4 mg total) by mouth 3 (three) times daily. (Patient not taking: Reported on 03/18/2017) 30 tablet 0  . traMADol (ULTRAM) 50 MG tablet Take 1 tablet (50 mg total) by mouth every 6 (six) hours as needed. (Patient not taking: Reported on 12/06/2017) 20 tablet 0    Results for orders placed or performed during the hospital encounter of 04/08/18 (from the past 48 hour(s))  Comprehensive metabolic panel     Status: Abnormal   Collection Time: 04/08/18  9:47 PM  Result Value Ref Range   Sodium 135 135 - 145 mmol/L   Potassium 3.9 3.5 - 5.1 mmol/L    Comment: HEMOLYSIS AT THIS LEVEL MAY AFFECT RESULT   Chloride 101 101 - 111 mmol/L   CO2 24 22 - 32 mmol/L   Glucose, Bld 222 (H) 65 - 99 mg/dL   BUN 16 6 - 20 mg/dL   Creatinine, Ser 0.86 0.61 - 1.24 mg/dL   Calcium 8.7 (L) 8.9 - 10.3 mg/dL   Total Protein 7.2 6.5 - 8.1  g/dL   Albumin 3.6 3.5 - 5.0 g/dL   AST 377 (H) 15 - 41 U/L   ALT 605 (H) 17 - 63 U/L   Alkaline Phosphatase 144 (H) 38 - 126 U/L   Total Bilirubin 0.9 0.3 - 1.2 mg/dL   GFR calc non Af Amer >60 >60 mL/min   GFR calc Af Amer >60 >60 mL/min    Comment: (NOTE) The eGFR has been calculated using the CKD EPI equation. This calculation has not been validated in all clinical situations. eGFR's persistently <60 mL/min signify possible Chronic Kidney Disease.    Anion gap 10 5 - 15    Comment: Performed at [facility], [address] [place], [place] [address]  Urinalysis, Complete w Microscopic     Status: Abnormal    Collection Time: 04/08/18  9:47 PM  Result Value Ref Range   Color, Urine YELLOW (A) YELLOW   APPearance CLEAR (A) CLEAR   Specific Gravity, Urine 1.019 1.005 - 1.030   pH 6.0 5.0 - 8.0   Glucose, UA 150 (A) NEGATIVE mg/dL   Hgb urine dipstick NEGATIVE NEGATIVE   Bilirubin Urine NEGATIVE NEGATIVE   Ketones, ur NEGATIVE NEGATIVE mg/dL   Protein, ur NEGATIVE NEGATIVE mg/dL   Nitrite NEGATIVE NEGATIVE   Leukocytes, UA TRACE (A) NEGATIVE   RBC / HPF 0-5 0 - 5 RBC/hpf   WBC, UA 0-5 0 - 5 WBC/hpf   Bacteria, UA NONE SEEN NONE SEEN   Squamous Epithelial / LPF 0-5 0 - 5    Comment: Performed at [facility], [address] [place], [place] [address]  CBC with Differential     Status: Abnormal   Collection Time: 04/08/18  9:47 PM  Result Value Ref Range   WBC 9.0 3.8 - 10.6 K/uL   RBC 5.63 4.40 - 5.90 MIL/uL   Hemoglobin 16.9 13.0 - 18.0 g/dL   HCT 49.6 40.0 - 52.0 %   MCV 88.1 80.0 - 100.0 fL   MCH 30.1 26.0 - 34.0 pg   MCHC 34.2 32.0 - 36.0 g/dL   RDW 16.4 (H) 11.5 - 14.5 %   Platelets 240 150 - 440 K/uL   Neutrophils Relative % 65 %   Neutro Abs 5.9 1.4 - 6.5 K/uL   Lymphocytes Relative 22 %   Lymphs Abs 2.0 1.0 - 3.6 K/uL   Monocytes Relative 7 %   Monocytes Absolute 0.6 0.2 - 1.0 K/uL   Eosinophils Relative 5 %   Eosinophils Absolute 0.5 0 - 0.7 K/uL   Basophils Relative 1 %   Basophils Absolute 0.1 0 - 0.1 K/uL    Comment: Performed at [facility], [address] [place], [place] [address]  Lipase, blood     Status: Abnormal   Collection Time: 04/08/18  9:47 PM  Result Value Ref Range   Lipase 55 (H) 11 - 51 U/L    Comment: Performed at [facility], [address] [place], [place] [address]  TSH     Status: None   Collection Time: 04/08/18  9:47 PM  Result Value Ref Range   TSH 0.794 0.350 - 4.500 uIU/mL    Comment: Performed by a 3rd Generation assay with a functional sensitivity of <=0.01 uIU/mL. Performed at [facility],  [address] [place], [place] [address]   Lipase, blood     Status: Abnormal   Collection Time: 04/09/18 12:42 AM  Result Value Ref Range   Lipase 293 (H) 11 -  51 U/L    Comment: Performed at [facility], [place], [place] [address]  Acetaminophen level     Status: Abnormal   Collection Time: 04/09/18 12:42 AM  Result Value Ref Range   Acetaminophen (Tylenol), Serum <10 (L) 10 - 30 ug/mL    Comment: (NOTE) Therapeutic concentrations vary significantly. A range of 10-30 ug/mL  may be an effective concentration for many patients. However, some  are best treated at concentrations outside of this range. Acetaminophen concentrations >150 ug/mL at 4 hours after ingestion  and >50 ug/mL at 12 hours after ingestion are often associated with  toxic reactions. Performed at [facility], [address], [place], [place] [address]    Ct Renal Joaquim Lai Study  Result Date: 04/09/2018 CLINICAL DATA:  Right flank pain and hematuria. History kidney stones. EXAM: CT ABDOMEN AND PELVIS WITHOUT CONTRAST TECHNIQUE: Multidetector CT imaging of the abdomen and pelvis was performed following the standard protocol without IV contrast. COMPARISON:  CT 03/21/2017 FINDINGS: Lower chest: The lung bases are clear. Hepatobiliary: Mild decreased hepatic density consistent with steatosis. No discrete focal lesion on noncontrast exam. Gallbladder physiologically distended, no calcified stone. No biliary dilatation. Pancreas: No ductal dilatation or inflammation. Spleen: Prominent size spanning 14.2 cm cranial caudal. Adrenals/Urinary Tract: Normal adrenal glands. No hydronephrosis or perinephric edema. Faint tiny stones in both renal collecting systems. Ureters are decompressed without stone along the course. Urinary bladder is partially distended without stone. No bladder wall thickening. Stomach/Bowel: Stomach is distended with ingested contents. No bowel wall thickening, inflammatory  change or obstruction. Normal appendix. Moderate stool burden throughout the colon. No colonic inflammation. Single colonic diverticula in the sigmoid colon. Vascular/Lymphatic: Mild but age advanced aorta bi-iliac atherosclerosis. Reactive appearing lymph nodes in the porta hepatis and portacaval stations. No enlarged lymph nodes in the abdomen or pelvis. Reproductive: Prostate is unremarkable. Other: No free air, free fluid, or intra-abdominal fluid collection. Minimal fat in both inguinal canals. Musculoskeletal: There are no acute or suspicious osseous abnormalities. IMPRESSION: 1. Punctate nonobstructing stones in both kidneys. No obstructive uropathy or ureteral calculi. 2. No acute findings in the abdomen/pelvis. 3. Incidental mild hepatic steatosis and Aortic Atherosclerosis (ICD10-I70.0). Electronically Signed   By: Jeb Levering M.D.   On: 04/09/2018 00:06    Review of Systems  Constitutional: Negative for chills and fever.  HENT: Negative for sore throat and tinnitus.   Eyes: Negative for blurred vision and redness.  Respiratory: Negative for cough and shortness of breath.   Cardiovascular: Negative for chest pain, palpitations, orthopnea and PND.  Gastrointestinal: Positive for abdominal pain. Negative for diarrhea, nausea and vomiting.  Genitourinary: Negative for dysuria, frequency and urgency.  Musculoskeletal: Negative for joint pain and myalgias.  Skin: Negative for rash.       No lesions  Neurological: Negative for speech change, focal weakness and weakness.  Endo/Heme/Allergies: Does not bruise/bleed easily.       No temperature intolerance  Psychiatric/Behavioral: Negative for depression and suicidal ideas.    Blood pressure (!) 146/96, pulse 67, temperature 97.7 F (36.5 C), temperature source Oral, resp. rate 18, height 6' (1.829 m), weight 95.3 kg (210 lb), SpO2 97 %. Physical Exam  Constitutional: He is oriented to person, place, and time. He appears well-developed  and well-nourished. No distress.  HENT:  Head: Normocephalic and atraumatic.  Mouth/Throat: Oropharynx is clear and moist.  Eyes: Pupils are equal, round, and reactive to light. Conjunctivae and EOM are normal. No scleral icterus.  Neck: Normal range of motion. Neck supple. No JVD present. No tracheal deviation present. No thyromegaly present.  Cardiovascular: Normal rate, regular rhythm and normal heart sounds. Exam reveals no gallop and no friction rub.  No murmur heard. Respiratory: Effort normal and breath sounds normal. No respiratory distress.  GI: Soft. Bowel sounds are normal. He exhibits no distension. There is splenomegaly. There is tenderness. There is no rebound and no guarding.  Palpable spleen  Genitourinary:  Genitourinary Comments: Deferred  Musculoskeletal: Normal range of motion. He exhibits no edema.  Lymphadenopathy:    He has no cervical adenopathy.  Neurological: He is alert and oriented to person, place, and time. No cranial nerve deficit.  Skin: Skin is warm and dry. No rash noted. No erythema.  Psychiatric: He has a normal mood and affect. His behavior is normal. Judgment and thought content normal.     Assessment/Plan This is a 37 year old male admitted for pancreatitis. 1.  Pancreatitis: Unclear origin.  Associated hepatitis.  Rule out infectious hepatitis as well as HIV.  The patient will remain n.p.o.  Hydrate with intravenous fluid.  Advance diet as tolerated. 2.  Splenomegaly: Consult general surgery regarding enlarged spleen size which may be upper limits of normal but is visibly enlarged and palpable on physical exam. Differential diagnosis of etiology includes thrombophlebitis.  Consider CTA of the abdomen. 3.  Methamphetamine abuse: The patient states that he last used 8 days ago.  History of IV drug abuse increases likelihood of thrombophlebitis 4.  Hypertension: Controlled, possibly secondary to pain. 5.  DVT prophylaxis: Lovenox 6.  GI prophylaxis:  None The patient is a full code.  Time spent on admission orders and patient care proximately 45 minutes  Harrie Foreman, MD 04/09/2018, 6:42 AM

## 2018-04-10 LAB — HEPATITIS PANEL, ACUTE
HEP A IGM: NEGATIVE
Hep B C IgM: NEGATIVE
Hepatitis B Surface Ag: NEGATIVE

## 2018-04-10 LAB — HIV ANTIBODY (ROUTINE TESTING W REFLEX): HIV Screen 4th Generation wRfx: NONREACTIVE

## 2018-04-13 ENCOUNTER — Telehealth: Payer: Self-pay | Admitting: Emergency Medicine

## 2018-04-13 NOTE — Telephone Encounter (Addendum)
Called patient to inform of hep c results and treatment options via pcp or via [place] [facility].  Left message asking him to call me.  04/14/2018--called pateint again.  The person answering was patient father (same name.)  He says his son does not have a phone. He does see him sometimes.   I asked him if he would ask patient to call me and he agreed.

## 2018-05-12 NOTE — Discharge Summary (Signed)
Date of admission April 09, 2018 Date left hospital AGAINST MEDICAL ADVICE June 13th 2019  Chief Complaint: Abdominal pain HPI: The patient with past medical history of methamphetamine and heroin abuse and hypertension presents to the emergency department with right upper quadrant abdominal pain.  The patient denies nausea or vomiting.  He states the pain has been intermittent over several months but has acutely worsened in the last 24 hours.  Laboratory evaluation revealed transaminitis as well as elevated lipase.  CT of his abdomen showed mild steatosis with physiologically distended gallbladder but no stones.  The patient's spleen was prominent in size as well.  Due to ongoing pain as well as hepatitis and pancreatitis emergency department staff called the hospitalist service for admission.  Hospital course  Patient was seen and evaluated by general surgery Dr. Excell Seltzerooper who has advised n.p.o. status for possible acute pancreatitis which could be drug-related.  Was not recommending any surgical interventions as the patient does not have gallstones but recommended to continue observation until the etiology of his abdominal pain is delineated.  Please review consult note  Patient has left hospital AGAINST MEDICAL ADVICE even before I rounded on him.  RN notified that patient is leaving AGAINST MEDICAL ADVICE

## 2018-11-29 ENCOUNTER — Emergency Department
Admission: EM | Admit: 2018-11-29 | Discharge: 2018-11-30 | Disposition: A | Discharge status: Home / Self Care | Payer: Self-pay | Attending: Emergency Medicine | Admitting: Emergency Medicine

## 2018-11-29 ENCOUNTER — Emergency Department: Payer: Self-pay

## 2018-11-29 ENCOUNTER — Encounter: Payer: Self-pay | Admitting: Emergency Medicine

## 2018-11-29 ENCOUNTER — Other Ambulatory Visit: Payer: Self-pay

## 2018-11-29 DIAGNOSIS — R45851 Suicidal ideations: Secondary | ICD-10-CM | POA: Insufficient documentation

## 2018-11-29 DIAGNOSIS — F1110 Opioid abuse, uncomplicated: Secondary | ICD-10-CM | POA: Insufficient documentation

## 2018-11-29 DIAGNOSIS — R21 Rash and other nonspecific skin eruption: Secondary | ICD-10-CM | POA: Insufficient documentation

## 2018-11-29 DIAGNOSIS — Z7289 Other problems related to lifestyle: Secondary | ICD-10-CM

## 2018-11-29 DIAGNOSIS — S51811A Laceration without foreign body of right forearm, initial encounter: Secondary | ICD-10-CM | POA: Insufficient documentation

## 2018-11-29 DIAGNOSIS — X789XXA Intentional self-harm by unspecified sharp object, initial encounter: Secondary | ICD-10-CM | POA: Insufficient documentation

## 2018-11-29 DIAGNOSIS — Y939 Activity, unspecified: Secondary | ICD-10-CM | POA: Insufficient documentation

## 2018-11-29 DIAGNOSIS — R000 Tachycardia, unspecified: Secondary | ICD-10-CM | POA: Insufficient documentation

## 2018-11-29 DIAGNOSIS — F17290 Nicotine dependence, other tobacco product, uncomplicated: Secondary | ICD-10-CM | POA: Insufficient documentation

## 2018-11-29 DIAGNOSIS — Z23 Encounter for immunization: Secondary | ICD-10-CM | POA: Insufficient documentation

## 2018-11-29 DIAGNOSIS — F1510 Other stimulant abuse, uncomplicated: Secondary | ICD-10-CM | POA: Insufficient documentation

## 2018-11-29 DIAGNOSIS — F1910 Other psychoactive substance abuse, uncomplicated: Secondary | ICD-10-CM

## 2018-11-29 DIAGNOSIS — Y999 Unspecified external cause status: Secondary | ICD-10-CM | POA: Insufficient documentation

## 2018-11-29 DIAGNOSIS — I10 Essential (primary) hypertension: Secondary | ICD-10-CM | POA: Insufficient documentation

## 2018-11-29 DIAGNOSIS — Y929 Unspecified place or not applicable: Secondary | ICD-10-CM | POA: Insufficient documentation

## 2018-11-29 HISTORY — DX: Unspecified viral hepatitis C without hepatic coma: B19.20

## 2018-11-29 LAB — COMPREHENSIVE METABOLIC PANEL
ALK PHOS: 53 U/L (ref 38–126)
ALT: 52 U/L — AB (ref 0–44)
ANION GAP: 9 (ref 5–15)
AST: 40 U/L (ref 15–41)
Albumin: 4.5 g/dL (ref 3.5–5.0)
BILIRUBIN TOTAL: 1.1 mg/dL (ref 0.3–1.2)
BUN: 15 mg/dL (ref 6–20)
CALCIUM: 9.1 mg/dL (ref 8.9–10.3)
CO2: 27 mmol/L (ref 22–32)
CREATININE: 0.99 mg/dL (ref 0.61–1.24)
Chloride: 104 mmol/L (ref 98–111)
GFR calc non Af Amer: >60 mL/min (ref >60)
Glucose, Bld: 97 mg/dL (ref 70–99)
Potassium: 3.2 mmol/L — ABNORMAL LOW (ref 3.5–5.1)
Sodium: 140 mmol/L (ref 135–145)
TOTAL PROTEIN: 7.8 g/dL (ref 6.5–8.1)

## 2018-11-29 LAB — ACETAMINOPHEN LEVEL

## 2018-11-29 LAB — CBC
HCT: 45 % (ref 39.0–52.0)
Hemoglobin: 15 g/dL (ref 13.0–17.0)
MCH: 29.7 pg (ref 26.0–34.0)
MCHC: 33.3 g/dL (ref 30.0–36.0)
MCV: 89.1 fL (ref 80.0–100.0)
Platelets: 238 10*3/uL (ref 150–400)
RBC: 5.05 MIL/uL (ref 4.22–5.81)
RDW: 12 % (ref 11.5–15.5)
WBC: 8.7 10*3/uL (ref 4.0–10.5)
nRBC: 0 % (ref 0.0–0.2)

## 2018-11-29 LAB — SALICYLATE LEVEL

## 2018-11-29 LAB — ETHANOL: Alcohol, Ethyl (B): <10 mg/dL (ref <10)

## 2018-11-29 MED ORDER — LORAZEPAM 2 MG/ML IJ SOLN
2.0000 mg | Freq: Once | INTRAMUSCULAR | Status: AC
  Administered 2018-11-29: 2 mg via INTRAVENOUS

## 2018-11-29 MED ORDER — LORAZEPAM 2 MG/ML IJ SOLN: 1.0000 mg | Freq: Once | INTRAMUSCULAR | Status: DC

## 2018-11-29 MED ORDER — BACITRACIN-NEOMYCIN-POLYMYXIN 400-5-5000 EX OINT: TOPICAL_OINTMENT | Freq: Two times a day (BID) | CUTANEOUS | Status: DC

## 2018-11-29 MED ORDER — SODIUM CHLORIDE 0.9 % IV BOLUS
1000.0000 mL | Freq: Once | INTRAVENOUS | Status: AC
  Administered 2018-11-29: 1000 mL via INTRAVENOUS

## 2018-11-29 MED ORDER — TETANUS-DIPHTH-ACELL PERTUSSIS 5-2.5-18.5 LF-MCG/0.5 IM SUSP
0.5000 mL | Freq: Once | INTRAMUSCULAR | Status: AC
  Administered 2018-11-29: 0.5 mL via INTRAMUSCULAR
  Filled 2018-11-29: qty 0.5

## 2018-11-29 MED ORDER — BACITRACIN-NEOMYCIN-POLYMYXIN 400-5-5000 EX OINT
TOPICAL_OINTMENT | Freq: Two times a day (BID) | CUTANEOUS | Status: DC
  Administered 2018-11-29: 1 via TOPICAL
  Filled 2018-11-29: qty 2

## 2018-11-29 MED ORDER — CLINDAMYCIN HCL 150 MG PO CAPS
300.0000 mg | ORAL_CAPSULE | Freq: Three times a day (TID) | ORAL | Status: DC
  Administered 2018-11-29: 300 mg via ORAL
  Filled 2018-11-29: qty 2

## 2018-11-29 MED ORDER — POTASSIUM CHLORIDE CRYS ER 20 MEQ PO TBCR
40.0000 meq | EXTENDED_RELEASE_TABLET | Freq: Once | ORAL | Status: AC
  Administered 2018-11-29: 40 meq via ORAL
  Filled 2018-11-29: qty 2

## 2018-11-29 MED ORDER — LORAZEPAM 2 MG/ML IJ SOLN
INTRAMUSCULAR | Status: AC
  Filled 2018-11-29: qty 1

## 2018-11-29 NOTE — ED Provider Notes (Addendum)
[facility] Emergency Department Provider Note  ____________________________________________  Time seen: Approximately 5:05 PM  I have reviewed the triage vital signs and the nursing notes.   HISTORY  Chief Complaint Medical Clearance and detox    HPI Isaiah RutterGary W Gugel Jr. is a 38 y.o. male with a history of polysubstance abuse, self-reported ongoing heroin and methamphetamine abuse presenting for wishes for detox.  Apparently, the police were called because he was in his car shaking in the car.  He reports he has been used heroin, as well as methamphetamines, last at 3 AM.  He denies any alcohol or other drug use.  He reports suicidal ideations with the previous history of self injury, now several self induced cuts including to the right forearm several days ago.  In addition, the patient has had full body excoriations, and he is unsure if this is from his heroin, or bugs.  He has not seen any insects or bugs.  Patient denies any homicidal ideations.  He occasionally has visual hallucinations but denies auditory hallucinations.   Past Medical History:  Diagnosis Date  . H/o Lyme disease   . Hepatitis C   . Heroin abuse ([facility])   . History of TIAs   . Hypertension   . Methamphetamine abuse [facility])     Patient Active Problem List   Diagnosis Date Noted  . Pancreatitis 04/09/2018  . Acute hepatitis   . Elevated LFTs   . Elevated lipase   . Polysubstance abuse ([facility])   . Drug overdose 12/06/2017  . 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 Opiate abuse, episodic ([facility]) 03/18/2017  . Substance induced mood disorder ([facility]) 03/18/2017  . Self-inflicted laceration of wrist 03/18/2017    History reviewed. No pertinent surgical history.  Current Outpatient Rx  . Order #: [id number] Class: Print  . Order #: [id number] Class: Print  . Order #: [id number] Class: Print    Allergies Aspirin; Penicillins; and Tegretol [carbamazepine]  Family History  Problem Relation Age of  Onset  . Diabetes Mellitus II Mother   . Diabetes Mellitus II Sister   . Hypertension Sister     Social History Social History   Tobacco Use  . Smoking status: Current Every Day Smoker    Packs/day: 1.00    Types: Cigars  . Smokeless tobacco: Never Used  Substance Use Topics  . Alcohol use: No  . Drug use: Yes    Types: IV    Comment: heroin, meth, cocaine    Review of Systems Constitutional: No fever/chills.  No lightheadedness or syncope. Eyes: No visual changes. ENT: No sore throat. No congestion or rhinorrhea. Cardiovascular: Denies chest pain. Denies palpitations. Respiratory: Denies shortness of breath.  No cough. Gastrointestinal: No abdominal pain.  No nausea, no vomiting.  No diarrhea.  No constipation. Genitourinary: Negative for dysuria. Musculoskeletal: Negative for back pain. Skin: Positive for self-inflicted laceration especially to the right forearm.  Positive for diffuse rash with healing scabs of multiple ages. Neurological: Negative for headaches. No focal numbness, tingling or weakness.  Psychiatric:Positive for heroin and methamphetamine use.  Positive for suicidal ideations with plan of self injury by cutting.  Negative homicidal ideations.  Positive visual hallucinations.  Negative auditory hallucinations.    ____________________________________________   PHYSICAL EXAM:  VITAL SIGNS: ED Triage Vitals  Enc Vitals Group     BP 11/29/18 1618 (!) 125/106     Pulse Rate 11/29/18 1618 (!) 124     Resp 11/29/18 1618 (!) 28     Temp --  Temp Source 11/29/18 1618 Oral     SpO2 11/29/18 1618 97 %     Weight 11/29/18 1626 180 lb (81.6 kg)     Height 11/29/18 1626 6\' 1"  (1.854 m)     Head Circumference --      Peak Flow --      Pain Score 11/29/18 1626 8     Pain Loc --      Pain Edu? --      Excl. in GC? --     Constitutional: The patient is alert, oriented and able to answer questions.  However, he is significantly agitated, moving about  the bed, and unable to sit still. Eyes: Conjunctivae are normal.  EOMI. No scleral icterus. Head: Atraumatic. Nose: No congestion/rhinnorhea. Mouth/Throat: Mucous membranes are dry.  Neck: No stridor.  Supple.  Positive mild JVD.  No meningismus. Cardiovascular: Fast rate, regular rhythm. No murmurs, rubs or gallops.  Respiratory: Normal respiratory effort.  No accessory muscle use or retractions. Lungs CTAB.  No wheezes, rales or ronchi. Gastrointestinal: Soft, nontender and nondistended.  No guarding or rebound.  No peritoneal signs. Musculoskeletal: No LE edema.  Neurologic:  A&Ox3.  Speech is clear.  Face and smile are symmetric.  EOMI.  Moves all extremities well. Skin:  Skin is warm and he is flushed generally.  In addition, the patient has multiple excoriations between 0.5 to 1 cm that are round and scabbed of different ages with some surrounding erythema but no significant area that is evident with fluctuance or purulent discharge.  The patient does have a 5 cm laceration to the right antecubital area with a healing scab. Psychiatric: The patient is agitated with some pressured speech.  He endorses SI at on my examination with a plan to cut himself.  He also endorses visual hallucinations.  No HI or auditory hallucinations.  ____________________________________________   LABS (all labs ordered are listed, but only abnormal results are displayed)  Labs Reviewed  COMPREHENSIVE METABOLIC PANEL - Abnormal; Notable for the following components:      Result Value   Potassium 3.2 (*)    ALT 52 (*)    All other components within normal limits  ACETAMINOPHEN LEVEL - Abnormal; Notable for the following components:   Acetaminophen (Tylenol), Serum <10 (*)    All other components within normal limits  ETHANOL  SALICYLATE LEVEL  CBC  URINE DRUG SCREEN, QUALITATIVE ([facility] ONLY)   ____________________________________________  EKG  ED ECG REPORT I, Anne-Caroline Sharma Covert, the attending  physician, personally viewed and interpreted this ECG.   Date: 11/29/2018  EKG Time: 1710  Rate: 109  Rhythm: sinus tachycardia  Axis: leftward  Intervals:none  ST&T Change: no STEMI  ____________________________________________  RADIOLOGY  No results found.  ____________________________________________   PROCEDURES  Procedure(s) performed: None  Procedures  Critical Care performed: No ____________________________________________   INITIAL IMPRESSION / ASSESSMENT AND PLAN / ED COURSE  Pertinent labs & imaging results that were available during my care of the patient were reviewed by me and considered in my medical decision making (see chart for details).  38 y.o. male with a history of polysubstance abuse including ongoing heroin and methamphetamine use, substance-induced mood disorder, presenting with SI, and evidence of self cutting.  Overall, the patient is significantly uncomfortable with tachycardia.  We will plan to give him Ativan, and intravenous fluids.  This is likely due to his drug abuse and I suspect that any other cause including cardiac causes would be much less likely.  I  will reevaluate him after fluids, benzodiazepines for symptomatic treatment, and have initiated psychiatric evaluation.  He has been placed under involuntary commitment due to his suicidal ideations.  For his excoriations, have placed the patient on clindamycin capsules, and he has been given a Tdap.  Laceration on his right upper extremity is clearly more than 24 hours old so suture repair is not indicated.  ----------------------------------------- 12:27 AM on 11/30/2018 -----------------------------------------  The patient has remained hemodynamically stable.  Initially, after his Ativan treatment he did become somnolent, but since has been able to stay awake, speak normally, and eat and drink without difficulty.  The patient was mildly hypokalemic and this has been supplemented.  The  remainder of his electrolytes are reassuring, his alcohol level was less than 10, and his blood counts are reassuring.  He has not yet given urine.  His chest x-ray did not show any acute process; his hypoxia was likely due to somnolence and mechanical airway obstruction as his neck was bending forward when he was having these episodes.  At this time, the patient is medically cleared for psychiatric disposition.  ____________________________________________  FINAL CLINICAL IMPRESSION(S) / ED DIAGNOSES  Final diagnoses:  Polysubstance abuse ([facility])  Sinus tachycardia  Suicidal ideation  Deliberate self-cutting  Rash         NEW MEDICATIONS STARTED DURING THIS VISIT:  New Prescriptions   No medications on file      Rockne MenghiniNorman, Anne-Caroline, MD 11/29/18 1724    Rockne MenghiniNorman, Anne-Caroline, MD 11/30/18 0028

## 2018-11-29 NOTE — ED Notes (Addendum)
Fiancee called.  Informed her that unable to give information at this time r/t HIPPA and need patients consent.  Explained patient is sleeping and will have him call when awake.  She is asking if he is here r/t overdose and informed her unable to give this information.

## 2018-11-29 NOTE — ED Notes (Signed)
Patient is unable to be aroused and cannot participate in assessment at this time.

## 2018-11-29 NOTE — ED Notes (Signed)
Warm blankets provided. Po fluids provided. Pt immediately back to sleep.

## 2018-11-29 NOTE — ED Notes (Signed)
Pt continues to sleep.

## 2018-11-29 NOTE — ED Notes (Signed)
Pt admits to SI without plan. Pt all over bed. Last used drugs this morning.  Denies ETOH use.

## 2018-11-29 NOTE — ED Notes (Signed)
Pt sleeping, too sleepy at this time to take po medication. Will hold med until pt awake.

## 2018-11-29 NOTE — ED Triage Notes (Signed)
Pt to ED via [employer] for medical clearance and detox. Pt is not currently in police custody. Per [employer] they got a call from a concerned citizen because pt was "bouncing around in his car". [employer] found meth and heroin in pts car. Pt admits to using meth and heroin mixed this morning around 0100 or 0200. Pt denies any alcohol use. Pt has multiple scabs all over his body.

## 2018-11-29 NOTE — ED Notes (Signed)
Report from valerie, rn.  

## 2018-11-29 NOTE — ED Notes (Signed)
Pt sleeping. resps unlabored, vss.

## 2018-11-29 NOTE — ED Notes (Signed)
Unable to obtain bp, pt will not hold still. Dr Sharma Covert at bedside.

## 2018-11-29 NOTE — ED Notes (Signed)
Pt sleeping. Wakes to tactile stimulation. Does not stay awake to answer questions. NRB removed. sats remain WNL.

## 2018-11-30 LAB — URINE DRUG SCREEN, QUALITATIVE (ARMC ONLY)
AMPHETAMINES, UR SCREEN: POSITIVE — AB
Barbiturates, Ur Screen: NOT DETECTED
Benzodiazepine, Ur Scrn: POSITIVE — AB
Cannabinoid 50 Ng, Ur ~~LOC~~: NOT DETECTED
Cocaine Metabolite,Ur ~~LOC~~: NOT DETECTED
MDMA (Ecstasy)Ur Screen: NOT DETECTED
METHADONE SCREEN, URINE: NOT DETECTED
Opiate, Ur Screen: POSITIVE — AB
Phencyclidine (PCP) Ur S: NOT DETECTED
TRICYCLIC, UR SCREEN: NOT DETECTED

## 2018-11-30 LAB — CK: Total CK: 216 U/L (ref 49–397)

## 2018-11-30 MED ORDER — CLINDAMYCIN HCL 300 MG PO CAPS: 300.0000 mg | ORAL_CAPSULE | Freq: Three times a day (TID) | ORAL | 0 refills | Status: DC

## 2018-11-30 NOTE — ED Notes (Signed)
Pt given bag of belongings 

## 2018-11-30 NOTE — ED Notes (Addendum)
Pt sleeping. 

## 2018-11-30 NOTE — ED Notes (Signed)
Meal tray and po fluids provided 

## 2018-11-30 NOTE — ED Notes (Signed)
Pt sleeping. resps unlabored.  

## 2018-11-30 NOTE — ED Notes (Signed)
Pt sleeping. 

## 2018-11-30 NOTE — Discharge Instructions (Signed)
Take antibiotic as prescribed (Clindamycin). Return to the ER for worsening symptoms, feelings of hurting yourself or others, or other concerns.

## 2018-11-30 NOTE — ED Notes (Signed)
Pt continues to sleep.

## 2018-11-30 NOTE — ED Provider Notes (Signed)
-----------------------------------------   3:03 AM on 11/30/2018 -----------------------------------------  Patient was evaluated by [facility] psychiatrist Dr. Marinda Elk who has rescinded his IVC and recommends he is psychiatrically stable for discharge home with outpatient follow-up.  Continue Neurontin for anxiety.  He will be discharged with prescription for Clindamycin given his skin abscesses.  Currently patient is still drowsy and fell asleep promptly after psychiatric interview.  Will keep patient in the ED overnight and discharge in the morning.   ----------------------------------------- 6:52 AM on 11/30/2018 -----------------------------------------  Patient sleeping.  Anticipate discharge home once he is awake and can find a ride.  Prescription for clindamycin printed and on patient's chart.   Irean Hong, MD 11/30/18 [phone]

## 2018-11-30 NOTE — ED Notes (Signed)
SOC complete.  

## 2018-11-30 NOTE — ED Notes (Signed)
Report to kaleigh, rn.  

## 2018-11-30 NOTE — ED Notes (Signed)
Pt given phone and numbers of fiance and cousin to provide discharge transportation

## 2019-04-26 IMAGING — CR DG CHEST 2V
1 series · 2 of 2 positions shown · non-contrast
Comparison: None.

CLINICAL DATA: Flu like symptoms x2 days

EXAM:
CHEST  2 VIEW

[Series 1: dg chest 2 view · 0.14mm/px · 2 of 2 slices shown]
[im 1/2]
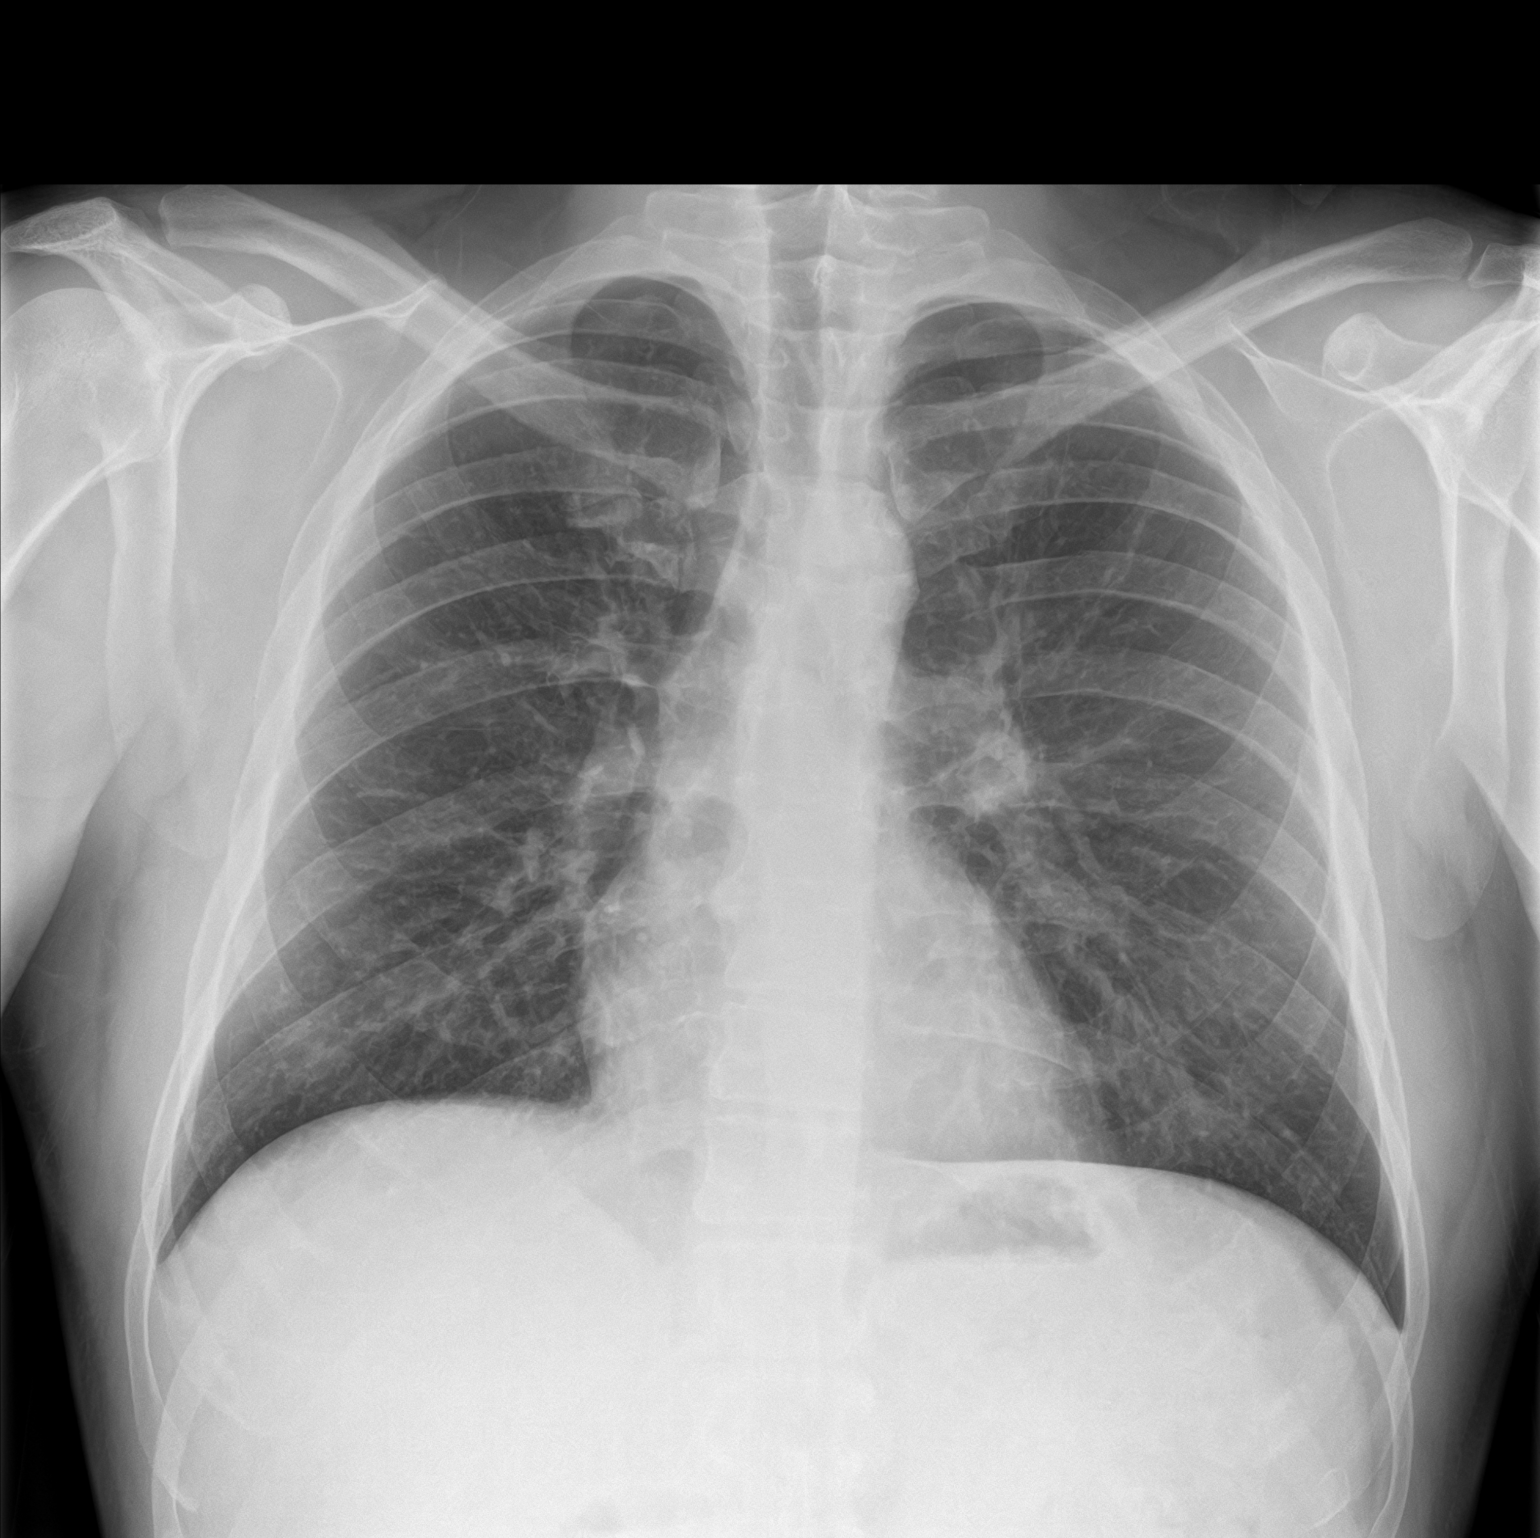
[im 2/2]
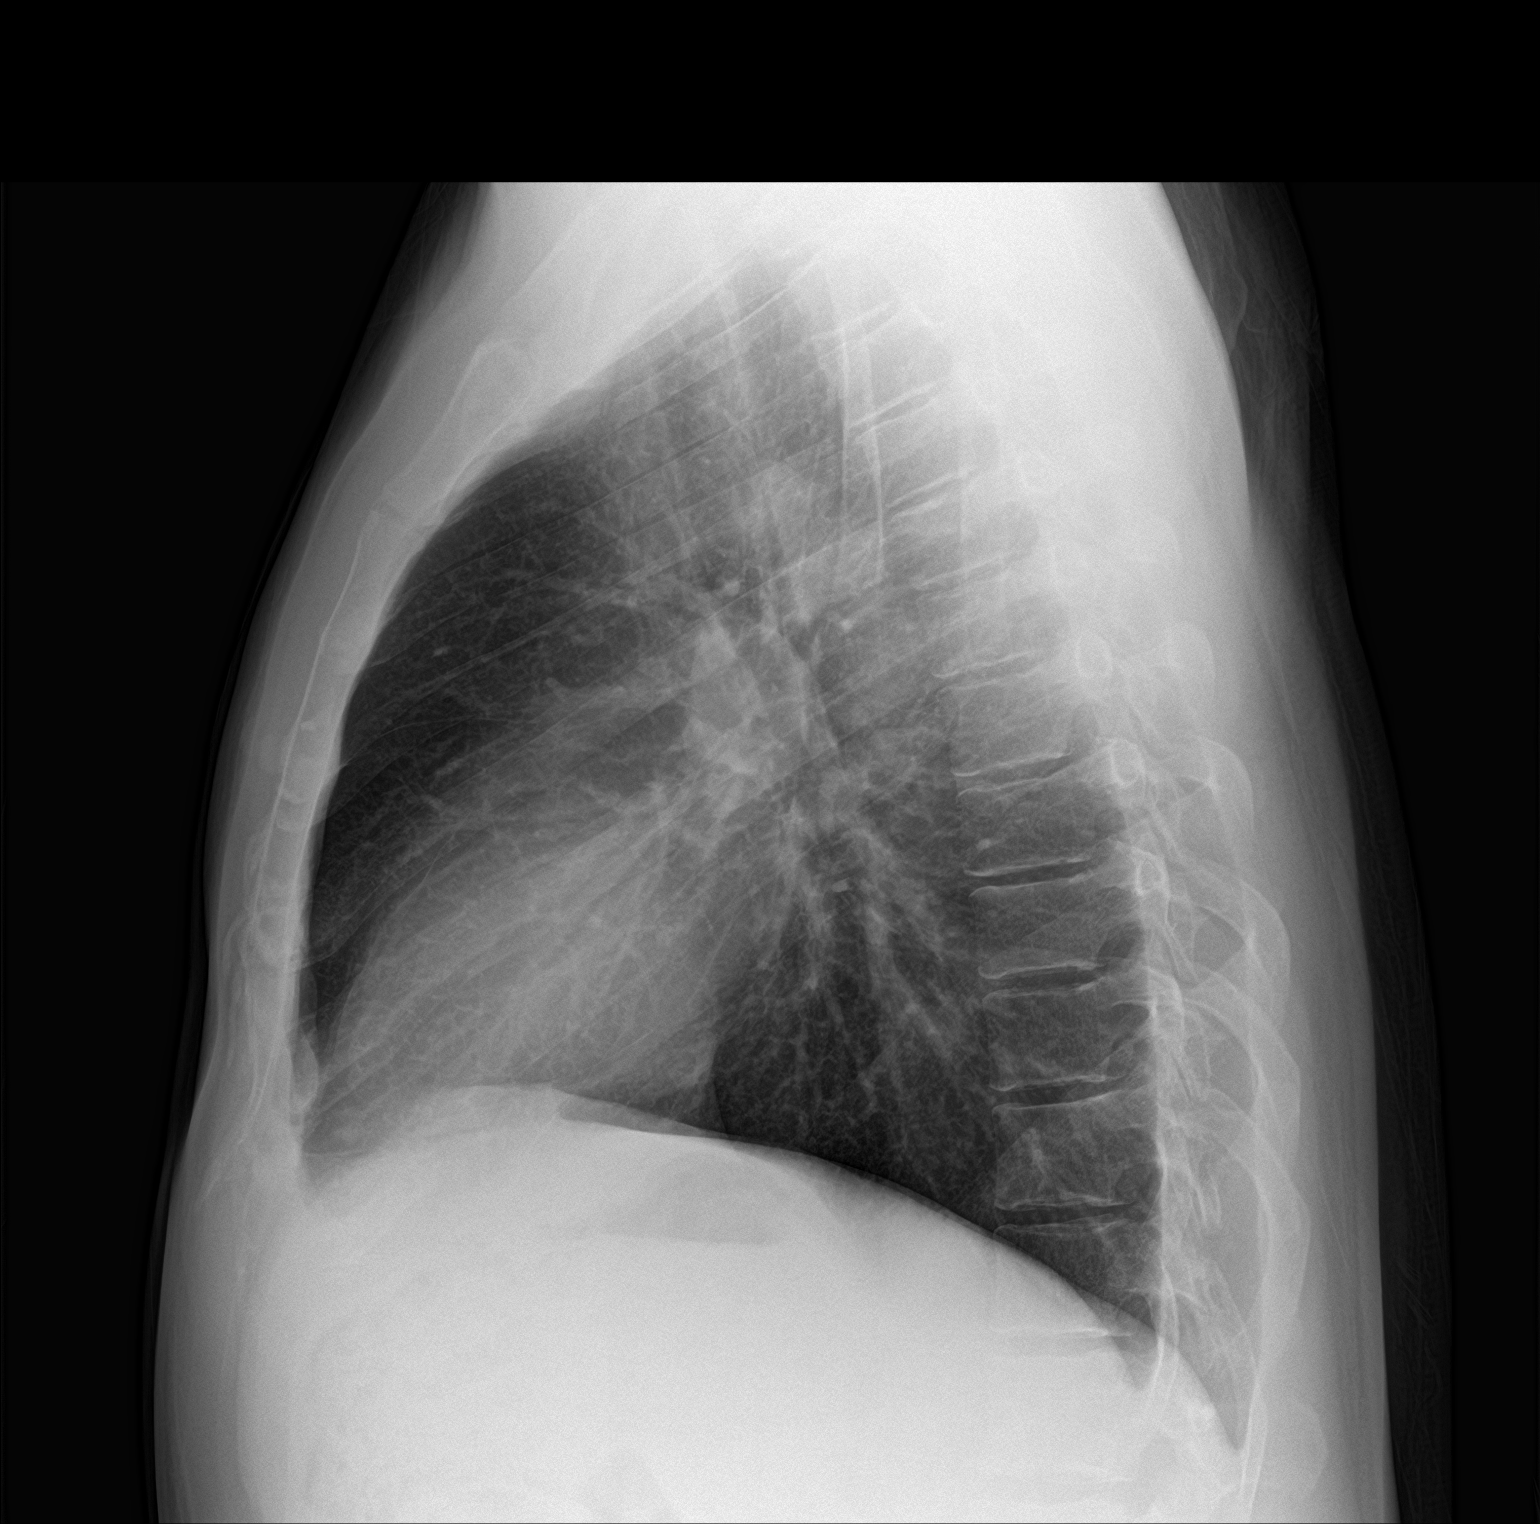

[2 of 2 positions shown; findings below may reference images not displayed]

FINDINGS: The heart size and mediastinal contours are within normal limits. No
pneumonic consolidation or CHF. No effusion or pneumothorax. The
visualized skeletal structures are unremarkable.
IMPRESSION: No active cardiopulmonary disease.

## 2019-11-15 ENCOUNTER — Other Ambulatory Visit: Payer: Self-pay

## 2019-11-15 ENCOUNTER — Encounter: Payer: Self-pay | Admitting: Radiology

## 2019-11-15 ENCOUNTER — Emergency Department
Admission: EM | Admit: 2019-11-15 | Discharge: 2019-11-15 | Disposition: A | Discharge status: Home / Self Care | Payer: Medicaid Other | Attending: Emergency Medicine | Admitting: Emergency Medicine

## 2019-11-15 ENCOUNTER — Emergency Department: Payer: Medicaid Other

## 2019-11-15 DIAGNOSIS — K859 Acute pancreatitis without necrosis or infection, unspecified: Secondary | ICD-10-CM | POA: Insufficient documentation

## 2019-11-15 DIAGNOSIS — R319 Hematuria, unspecified: Secondary | ICD-10-CM | POA: Diagnosis not present

## 2019-11-15 DIAGNOSIS — Z79899 Other long term (current) drug therapy: Secondary | ICD-10-CM | POA: Diagnosis not present

## 2019-11-15 DIAGNOSIS — F17290 Nicotine dependence, other tobacco product, uncomplicated: Secondary | ICD-10-CM | POA: Diagnosis not present

## 2019-11-15 DIAGNOSIS — I10 Essential (primary) hypertension: Secondary | ICD-10-CM | POA: Diagnosis not present

## 2019-11-15 DIAGNOSIS — R109 Unspecified abdominal pain: Secondary | ICD-10-CM | POA: Diagnosis present

## 2019-11-15 LAB — COMPREHENSIVE METABOLIC PANEL
ALT: 46 U/L — ABNORMAL HIGH (ref 0–44)
AST: 29 U/L (ref 15–41)
Albumin: 4.1 g/dL (ref 3.5–5.0)
Alkaline Phosphatase: 54 U/L (ref 38–126)
Anion gap: 8 (ref 5–15)
BUN: 18 mg/dL (ref 6–20)
CO2: 24 mmol/L (ref 22–32)
Calcium: 9.1 mg/dL (ref 8.9–10.3)
Chloride: 108 mmol/L (ref 98–111)
Creatinine, Ser: 0.86 mg/dL (ref 0.61–1.24)
GFR calc Af Amer: >60 mL/min (ref >60)
GFR calc non Af Amer: >60 mL/min (ref >60)
Glucose, Bld: 139 mg/dL — ABNORMAL HIGH (ref 70–99)
Potassium: 3.6 mmol/L (ref 3.5–5.1)
Sodium: 140 mmol/L (ref 135–145)
Total Bilirubin: 0.6 mg/dL (ref 0.3–1.2)
Total Protein: 7.7 g/dL (ref 6.5–8.1)

## 2019-11-15 LAB — CBC WITH DIFFERENTIAL/PLATELET
Abs Immature Granulocytes: 0.09 10*3/uL — ABNORMAL HIGH (ref 0.00–0.07)
Basophils Absolute: 0 10*3/uL (ref 0.0–0.1)
Basophils Relative: 1 %
Eosinophils Absolute: 0.3 10*3/uL (ref 0.0–0.5)
Eosinophils Relative: 3 %
HCT: 48.3 % (ref 39.0–52.0)
Hemoglobin: 16.6 g/dL (ref 13.0–17.0)
Immature Granulocytes: 1 %
Lymphocytes Relative: 38 %
Lymphs Abs: 3.2 10*3/uL (ref 0.7–4.0)
MCH: 30.1 pg (ref 26.0–34.0)
MCHC: 34.4 g/dL (ref 30.0–36.0)
MCV: 87.5 fL (ref 80.0–100.0)
Monocytes Absolute: 0.6 10*3/uL (ref 0.1–1.0)
Monocytes Relative: 7 %
Neutro Abs: 4.2 10*3/uL (ref 1.7–7.7)
Neutrophils Relative %: 50 %
Platelets: 191 10*3/uL (ref 150–400)
RBC: 5.52 MIL/uL (ref 4.22–5.81)
RDW: 12.4 % (ref 11.5–15.5)
WBC: 8.4 10*3/uL (ref 4.0–10.5)
nRBC: 0 % (ref 0.0–0.2)

## 2019-11-15 LAB — URINALYSIS, COMPLETE (UACMP) WITH MICROSCOPIC
Bacteria, UA: NONE SEEN
Bilirubin Urine: NEGATIVE
Glucose, UA: NEGATIVE mg/dL
Hgb urine dipstick: NEGATIVE
Ketones, ur: NEGATIVE mg/dL
Nitrite: NEGATIVE
Protein, ur: NEGATIVE mg/dL
Specific Gravity, Urine: 1.021 (ref 1.005–1.030)
Squamous Epithelial / HPF: NONE SEEN (ref 0–5)
pH: 5 (ref 5.0–8.0)

## 2019-11-15 LAB — URINE DRUG SCREEN, QUALITATIVE (ARMC ONLY)
Amphetamines, Ur Screen: NOT DETECTED
Barbiturates, Ur Screen: NOT DETECTED
Benzodiazepine, Ur Scrn: NOT DETECTED
Cannabinoid 50 Ng, Ur ~~LOC~~: NOT DETECTED
Cocaine Metabolite,Ur ~~LOC~~: NOT DETECTED
MDMA (Ecstasy)Ur Screen: NOT DETECTED
Methadone Scn, Ur: NOT DETECTED
Opiate, Ur Screen: POSITIVE — AB
Phencyclidine (PCP) Ur S: NOT DETECTED
Tricyclic, Ur Screen: NOT DETECTED

## 2019-11-15 LAB — LIPASE, BLOOD: Lipase: 71 U/L — ABNORMAL HIGH (ref 11–51)

## 2019-11-15 MED ORDER — KETOROLAC TROMETHAMINE 30 MG/ML IJ SOLN
15.0000 mg | Freq: Once | INTRAMUSCULAR | Status: AC
  Administered 2019-11-15: 06:00:00 15 mg via INTRAVENOUS
  Filled 2019-11-15: qty 1

## 2019-11-15 MED ORDER — IBUPROFEN 800 MG PO TABS: 800.0000 mg | ORAL_TABLET | Freq: Three times a day (TID) | ORAL | 0 refills | Status: DC | PRN

## 2019-11-15 MED ORDER — ACETAMINOPHEN 500 MG PO TABS
1000.0000 mg | ORAL_TABLET | Freq: Once | ORAL | Status: AC
  Administered 2019-11-15: 1000 mg via ORAL
  Filled 2019-11-15: qty 2

## 2019-11-15 MED ORDER — IOHEXOL 300 MG/ML  SOLN
100.0000 mL | Freq: Once | INTRAMUSCULAR | Status: AC | PRN
  Administered 2019-11-15: 100 mL via INTRAVENOUS

## 2019-11-15 MED ORDER — ONDANSETRON HCL 4 MG/2ML IJ SOLN
4.0000 mg | Freq: Once | INTRAMUSCULAR | Status: AC
  Administered 2019-11-15: 4 mg via INTRAVENOUS
  Filled 2019-11-15: qty 2

## 2019-11-15 MED ORDER — ONDANSETRON 4 MG PO TBDP: 4.0000 mg | ORAL_TABLET | Freq: Three times a day (TID) | ORAL | 0 refills | Status: DC | PRN

## 2019-11-15 MED ORDER — KETOROLAC TROMETHAMINE 30 MG/ML IJ SOLN
15.0000 mg | Freq: Once | INTRAMUSCULAR | Status: AC
  Administered 2019-11-15: 15 mg via INTRAVENOUS
  Filled 2019-11-15: qty 1

## 2019-11-15 NOTE — ED Notes (Signed)
Patient transported to CT 

## 2019-11-15 NOTE — ED Triage Notes (Signed)
Reports symptoms for 4 days.  Reports pain worse with laying.

## 2019-11-15 NOTE — ED Provider Notes (Signed)
[facility] Emergency Department Provider Note  ____________________________________________  Time seen: Approximately 4:09 AM  I have reviewed the triage vital signs and the nursing notes.   HISTORY  Chief Complaint No chief complaint on file.   HPI Isaiah Green. is a 39 y.o. male with history of polysubstance abuse including methamphetamines and heroin, Lyme disease, hepatitis C, hypertension who presents for evaluation of abdominal pain.  Patient reports 5 days of sharp severe right-sided abdominal pain radiating to his back.  No nausea or vomiting.  Has noted some hematuria but no dysuria.  No chest pain but does report that the pain is worse when he takes a deep breath.  No diarrhea or constipation.  No prior abdominal surgeries.   Past Medical History:  Diagnosis Date  . H/o Lyme disease   . Hepatitis C   . Heroin abuse ([place])   . History of TIAs   . Hypertension   . Methamphetamine abuse [facility])     Patient Active Problem List   Diagnosis Date Noted  . Pancreatitis 04/09/2018  . Acute hepatitis   . Elevated LFTs   . Elevated lipase   . Polysubstance abuse ([place])   . Drug overdose 12/06/2017  . 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 Opiate abuse, episodic ([place]) 03/18/2017  . Substance induced mood disorder ([place]) 03/18/2017  . Self-inflicted laceration of wrist ([place]) 03/18/2017    No past surgical history on file.  Prior to Admission medications   Medication Sig Start Date End Date Taking? Authorizing Provider  clindamycin (CLEOCIN) 300 MG capsule Take 1 capsule (300 mg total) by mouth 3 (three) times daily. 11/30/18   Paulette Blanch, MD  diclofenac (VOLTAREN) 50 MG EC tablet Take 1 tablet (50 mg total) by mouth 2 (two) times daily. Patient not taking: Reported on 04/09/2018 02/21/18   Darel Hong, MD  ibuprofen (ADVIL) 800 MG tablet Take 1 tablet (800 mg total) by mouth every 8 (eight) hours as needed. 11/15/19   Rudene Re, MD    naproxen (NAPROSYN) 500 MG tablet Take 1 tablet (500 mg total) by mouth 2 (two) times daily with a meal. Patient not taking: Reported on 03/18/2017 06/24/16   Sherrie George B, FNP  ondansetron (ZOFRAN ODT) 4 MG disintegrating tablet Take 1 tablet (4 mg total) by mouth every 8 (eight) hours as needed. 11/15/19   Rudene Re, MD  tiZANidine (ZANAFLEX) 4 MG tablet Take 1 tablet (4 mg total) by mouth 3 (three) times daily. Patient not taking: Reported on 03/18/2017 06/24/16   Sherrie George B, FNP    Allergies Aspirin, Penicillins, and Tegretol [carbamazepine]  Family History  Problem Relation Age of Onset  . Diabetes Mellitus II Mother   . Diabetes Mellitus II Sister   . Hypertension Sister     Social History Social History   Tobacco Use  . Smoking status: Current Every Day Smoker    Packs/day: 1.00    Types: Cigars  . Smokeless tobacco: Never Used  Substance Use Topics  . Alcohol use: No  . Drug use: Yes    Types: IV    Comment: heroin, meth, cocaine    Review of Systems  Constitutional: Negative for fever. Eyes: Negative for visual changes. ENT: Negative for sore throat. Neck: No neck pain  Cardiovascular: Negative for chest pain. Respiratory: Negative for shortness of breath. Gastrointestinal: + right sided abdominal pain. No vomiting or diarrhea. Genitourinary: Negative for dysuria. Musculoskeletal: Negative for back pain. Skin: Negative for  rash. Neurological: Negative for headaches, weakness or numbness. Psych: No SI or HI  ____________________________________________   PHYSICAL EXAM:  VITAL SIGNS: ED Triage Vitals [11/15/19 0359]  Enc Vitals Group     BP      Pulse      Resp      Temp      Temp src      SpO2      Weight 227 lb (103 kg)     Height '6\' 1"'$  (1.854 m)     Head Circumference      Peak Flow      Pain Score 8     Pain Loc      Pain Edu?      Excl. in [place]?     Constitutional: Alert and oriented, looks uncomfortable but in no  obvious distress.  HEENT:      Head: Normocephalic and atraumatic.         Eyes: Conjunctivae are normal. Sclera is non-icteric.       Mouth/Throat: Mucous membranes are moist.       Neck: Supple with no signs of meningismus. Cardiovascular: Regular rate and rhythm. No murmurs, gallops, or rubs. 2+ symmetrical distal pulses are present in all extremities. No JVD. Respiratory: Normal respiratory effort. Lungs are clear to auscultation bilaterally. No wheezes, crackles, or rhonchi.  Gastrointestinal: Soft, tender to palpation in the right lower quadrant and right upper quadrant with negative Murphy sign, and non distended with positive bowel sounds. No rebound or guarding. Genitourinary: R CVA tenderness. Musculoskeletal: Nontender with normal range of motion in all extremities. No edema, cyanosis, or erythema of extremities. Neurologic: Normal speech and language. Face is symmetric. Moving all extremities. No gross focal neurologic deficits are appreciated. Skin: Skin is warm, dry and intact. No rash noted. Psychiatric: Mood and affect are normal. Speech and behavior are normal.  ____________________________________________   LABS (all labs ordered are listed, but only abnormal results are displayed)  Labs Reviewed  CBC WITH DIFFERENTIAL/PLATELET - Abnormal; Notable for the following components:      Result Value   Abs Immature Granulocytes 0.09 (*)    All other components within normal limits  COMPREHENSIVE METABOLIC PANEL - Abnormal; Notable for the following components:   Glucose, Bld 139 (*)    ALT 46 (*)    All other components within normal limits  URINALYSIS, COMPLETE (UACMP) WITH MICROSCOPIC - Abnormal; Notable for the following components:   Color, Urine YELLOW (*)    APPearance CLEAR (*)    Leukocytes,Ua SMALL (*)    All other components within normal limits  LIPASE, BLOOD - Abnormal; Notable for the following components:   Lipase 71 (*)    All other components within  normal limits  URINE DRUG SCREEN, QUALITATIVE ([facility] ONLY) - Abnormal; Notable for the following components:   Opiate, Ur Screen POSITIVE (*)    All other components within normal limits   ____________________________________________  EKG  ED ECG REPORT I, Rudene Re, the attending physician, personally viewed and interpreted this ECG.  Normal sinus rhythm, rate of 78, normal intervals, normal axis, no ST elevations or depressions ____________________________________________  RADIOLOGY  I have personally reviewed the images performed during this visit and I agree with the Radiologist's read.   Interpretation by Radiologist:  CT ABDOMEN PELVIS W CONTRAST  Result Date: 11/15/2019 CLINICAL DATA:  Right lower quadrant pain for 4 days. EXAM: CT ABDOMEN AND PELVIS WITH CONTRAST TECHNIQUE: Multidetector CT imaging of the abdomen and pelvis was  performed using the standard protocol following bolus administration of intravenous contrast. CONTRAST:  181m OMNIPAQUE IOHEXOL 300 MG/ML  SOLN COMPARISON:  04/08/2018 FINDINGS: Lower chest: Mild dependent atelectasis in the lower lobes. No pleural effusion. Hepatobiliary: Diffusely decreased attenuation of the liver compatible with steatosis. Largely collapsed gallbladder. No biliary dilatation. Pancreas: Unremarkable. Spleen: Unremarkable. Adrenals/Urinary Tract: Unremarkable adrenal glands. Punctate renal calculi bilaterally. No hydronephrosis. Subcentimeter hypodensity in the right kidney, too small to fully characterize. Unremarkable bladder. Stomach/Bowel: There is a small sliding hiatal hernia. There is no evidence of bowel obstruction or inflammation. The appendix is unremarkable. Vascular/Lymphatic: Aortic atherosclerosis without aneurysm. No enlarged lymph nodes. Reproductive: Unremarkable prostate. Other: Small fat containing inguinal hernias bilaterally. No intraperitoneal free fluid. Musculoskeletal: No acute osseous abnormality or  suspicious osseous lesion. IMPRESSION: 1. No acute abnormality identified in the abdomen or pelvis. 2. Hepatic steatosis. 3. Nonobstructing bilateral nephrolithiasis. 4. Small hiatal hernia. 5.  Aortic Atherosclerosis (ICD10-I70.0). Electronically Signed   By: ALogan BoresM.D.   On: 11/15/2019 05:48      ____________________________________________   PROCEDURES  Procedure(s) performed: None Procedures Critical Care performed:  None ____________________________________________   INITIAL IMPRESSION / ASSESSMENT AND PLAN / ED COURSE  39y.o. male with history of polysubstance abuse including methamphetamines and heroin, Lyme disease, hepatitis C, hypertension who presents for evaluation of R sided abdominal pain x 5 days and mild hematuria.  Ddx kidney stones versus pyelonephritis versus hepatitis versus pancreatitis versus gallbladder disease versus appendicitis versus gastritis versus diverticulitis  Patient looks uncomfortable but in no obvious distress, has normal vital signs, abdomen is tender on the right quadrants with no rebound or guarding, right CVA tenderness.  We will give Toradol and Zofran for symptom relief.  Will get labs, urinalysis, and CT abdomen pelvis.    _________________________ 6:01 AM on 11/15/2019 -----------------------------------------  Labs showing mildly elevated lipase consistent with pancreatitis.  CT showing no evidence of abscess or necrosis.  LFTs within baseline, normal T bili and alk phos with normal gallbladder on CT.  Patient has had prior episodes of pancreatitis attributed to drug use.  His drug screen is positive for opiates although review of [place] Controlled Substance Database shows no prescriptions for narcotics for this patient.  Patient will be discharged home on ibuprofen and Zofran, bland diet, and follow-up with PCP.  Discussed my standard precautions for worsening pain or fever.    As part of my medical decision making, I reviewed the  following data within the [place] reviewed and incorporated, Labs reviewed , EKG interpreted , Old chart reviewed, Radiograph reviewed , Notes from prior ED visits and [place] Controlled Substance Database   Please note:  Patient was evaluated in Emergency Department today for the symptoms described in the history of present illness. Patient was evaluated in the context of the global COVID-19 pandemic, which necessitated consideration that the patient might be at risk for infection with the SARS-CoV-2 virus that causes COVID-19. Institutional protocols and algorithms that pertain to the evaluation of patients at risk for COVID-19 are in a state of rapid change based on information released by regulatory bodies including the CDC and federal and state organizations. These policies and algorithms were followed during the patient's care in the ED.  Some ED evaluations and interventions may be delayed as a result of limited staffing during the pandemic.   ____________________________________________   FINAL CLINICAL IMPRESSION(S) / ED DIAGNOSES   Final diagnoses:  Acute pancreatitis without infection or necrosis, unspecified  pancreatitis type      NEW MEDICATIONS STARTED DURING THIS VISIT:  ED Discharge Orders         Ordered    ondansetron (ZOFRAN ODT) 4 MG disintegrating tablet  Every 8 hours PRN     11/15/19 0600    ibuprofen (ADVIL) 800 MG tablet  Every 8 hours PRN     11/15/19 0600           Note:  This document was prepared using Dragon voice recognition software and may include unintentional dictation errors.    Alfred Levins, [place], MD 11/15/19 [phone]

## 2022-10-28 HISTORY — PX: UPPER GI ENDOSCOPY: SHX6162

## 2023-05-05 ENCOUNTER — Ambulatory Visit
Admission: RE | Admit: 2023-05-05 | Discharge: 2023-05-05 | Disposition: A | Discharge status: Home / Self Care | Payer: Medicaid Other | Source: Ambulatory Visit | Attending: Physician Assistant | Admitting: Physician Assistant

## 2023-05-05 ENCOUNTER — Ambulatory Visit
Admission: RE | Admit: 2023-05-05 | Discharge: 2023-05-05 | Disposition: A | Discharge status: Home / Self Care | Payer: Medicaid Other | Attending: Physician Assistant | Admitting: Physician Assistant

## 2023-05-05 ENCOUNTER — Other Ambulatory Visit: Payer: Self-pay | Admitting: Internal Medicine

## 2023-05-05 ENCOUNTER — Other Ambulatory Visit: Payer: Self-pay | Admitting: Physician Assistant

## 2023-05-05 DIAGNOSIS — R0602 Shortness of breath: Secondary | ICD-10-CM

## 2023-05-05 DIAGNOSIS — R059 Cough, unspecified: Secondary | ICD-10-CM | POA: Diagnosis present

## 2023-05-05 DIAGNOSIS — R0789 Other chest pain: Secondary | ICD-10-CM

## 2023-05-12 ENCOUNTER — Other Ambulatory Visit: Payer: Self-pay | Admitting: Nurse Practitioner

## 2023-05-12 DIAGNOSIS — R748 Abnormal levels of other serum enzymes: Secondary | ICD-10-CM

## 2023-05-12 DIAGNOSIS — B182 Chronic viral hepatitis C: Secondary | ICD-10-CM

## 2023-05-20 ENCOUNTER — Other Ambulatory Visit: Payer: Medicaid Other

## 2023-05-20 ENCOUNTER — Ambulatory Visit
Admission: RE | Admit: 2023-05-20 | Discharge: 2023-05-20 | Disposition: A | Discharge status: Home / Self Care | Payer: Medicaid Other | Source: Ambulatory Visit | Attending: Nurse Practitioner | Admitting: Nurse Practitioner

## 2023-05-20 DIAGNOSIS — B182 Chronic viral hepatitis C: Secondary | ICD-10-CM | POA: Insufficient documentation

## 2023-05-20 DIAGNOSIS — R748 Abnormal levels of other serum enzymes: Secondary | ICD-10-CM | POA: Insufficient documentation

## 2023-05-22 ENCOUNTER — Telehealth (HOSPITAL_COMMUNITY): Payer: Self-pay | Admitting: *Deleted

## 2023-05-22 ENCOUNTER — Encounter (HOSPITAL_COMMUNITY): Payer: Self-pay

## 2023-05-22 MED ORDER — IVABRADINE HCL 7.5 MG PO TABS: ORAL_TABLET | ORAL | 0 refills | Status: DC

## 2023-05-22 NOTE — Telephone Encounter (Signed)
Reaching out to patient to offer assistance regarding upcoming cardiac imaging study; pt verbalizes understanding of appt date/time, parking situation and where to check in, pre-test NPO status and medications ordered, and verified current allergies; name and call back number provided for further questions should they arise  Larey Brick RN Navigator Cardiac Imaging [facility] [phone] office [phone] cell  Patient reports systolic BP is about 120 while his HR runs from 80's - 100's after starting 50mg  metoprolol tartrate daily.  Patient to take 100mg  metoprolol tartrate and 15mg  ivabradine two hours prior to his cardiac CT scan.

## 2023-05-26 ENCOUNTER — Ambulatory Visit
Admission: RE | Admit: 2023-05-26 | Discharge: 2023-05-26 | Disposition: A | Discharge status: Home / Self Care | Payer: Medicaid Other | Source: Ambulatory Visit | Attending: Internal Medicine | Admitting: Internal Medicine

## 2023-05-26 DIAGNOSIS — R0789 Other chest pain: Secondary | ICD-10-CM | POA: Diagnosis present

## 2023-05-26 DIAGNOSIS — R0602 Shortness of breath: Secondary | ICD-10-CM | POA: Diagnosis present

## 2023-05-26 MED ORDER — IOHEXOL 350 MG/ML SOLN
80.0000 mL | Freq: Once | INTRAVENOUS | Status: AC | PRN
  Administered 2023-05-26: 80 mL via INTRAVENOUS

## 2023-05-26 MED ORDER — NITROGLYCERIN 0.4 MG SL SUBL
SUBLINGUAL_TABLET | SUBLINGUAL | Status: AC
  Filled 2023-05-26: qty 1

## 2023-05-26 MED ORDER — NITROGLYCERIN 0.4 MG SL SUBL
0.8000 mg | SUBLINGUAL_TABLET | Freq: Once | SUBLINGUAL | Status: AC
  Administered 2023-05-26: 0.8 mg via SUBLINGUAL

## 2023-05-26 MED ORDER — METOPROLOL TARTRATE 5 MG/5ML IV SOLN
10.0000 mg | Freq: Once | INTRAVENOUS | Status: AC
  Administered 2023-05-26: 10 mg via INTRAVENOUS

## 2023-05-26 MED ORDER — METOPROLOL TARTRATE 5 MG/5ML IV SOLN
INTRAVENOUS | Status: AC
  Filled 2023-05-26: qty 10

## 2023-05-26 NOTE — Progress Notes (Signed)
Patient tolerated procedure well. W/C to lobby.  Ambulate w/o difficulty. Denies light headedness or being dizzy. Encouraged to drink extra water today and reasoning explained. Verbalized understanding. All questions answered. ABC intact. No further needs. Discharge from procedure area w/o issues.

## 2023-06-04 ENCOUNTER — Ambulatory Visit
Admit: 2023-06-04 | Discharge: 2023-06-05 | Payer: PRIVATE HEALTH INSURANCE | Attending: Gastroenterology | Primary: Gastroenterology

## 2023-06-04 DIAGNOSIS — B182 Chronic viral hepatitis C: Principal | ICD-10-CM

## 2023-06-04 LAB — COMPREHENSIVE METABOLIC PANEL
ALBUMIN: 4.1 g/dL (ref 3.4–5.0)
ALKALINE PHOSPHATASE: 65 U/L (ref 46–116)
ALT (SGPT): 315 U/L — ABNORMAL HIGH (ref 10–49)
ANION GAP: 6 mmol/L (ref 5–14)
AST (SGOT): 189 U/L — ABNORMAL HIGH (ref <=34)
BILIRUBIN TOTAL: 1.3 mg/dL — ABNORMAL HIGH (ref 0.3–1.2)
BLOOD UREA NITROGEN: 12 mg/dL (ref 9–23)
BUN / CREAT RATIO: 15
CALCIUM: 9.6 mg/dL (ref 8.7–10.4)
CHLORIDE: 105 mmol/L (ref 98–107)
CO2: 25.7 mmol/L (ref 20.0–31.0)
CREATININE: 0.81 mg/dL
EGFR CKD-EPI (2021) MALE: >90 mL/min/{1.73_m2} (ref >=60)
GLUCOSE RANDOM: 109 mg/dL — ABNORMAL HIGH (ref 70–99)
POTASSIUM: 3.9 mmol/L (ref 3.4–4.8)
PROTEIN TOTAL: 7.9 g/dL (ref 5.7–8.2)
SODIUM: 137 mmol/L (ref 135–145)

## 2023-06-04 LAB — PROTIME-INR
INR: 1.06
PROTIME: 11.8 s (ref 9.9–12.6)

## 2023-06-04 LAB — CBC
HEMATOCRIT: 54.6 % — ABNORMAL HIGH (ref 39.0–48.0)
HEMOGLOBIN: 18.2 g/dL — ABNORMAL HIGH (ref 12.9–16.5)
MEAN CORPUSCULAR HEMOGLOBIN CONC: 33.3 g/dL (ref 32.0–36.0)
MEAN CORPUSCULAR HEMOGLOBIN: 30 pg (ref 25.9–32.4)
MEAN CORPUSCULAR VOLUME: 90 fL (ref 77.6–95.7)
MEAN PLATELET VOLUME: 9.5 fL (ref 6.8–10.7)
PLATELET COUNT: 187 10*9/L (ref 150–450)
RED BLOOD CELL COUNT: 6.07 10*12/L — ABNORMAL HIGH (ref 4.26–5.60)
RED CELL DISTRIBUTION WIDTH: 14.2 % (ref 12.2–15.2)
WBC ADJUSTED: 8 10*9/L (ref 3.6–11.2)

## 2023-06-04 LAB — AFP TUMOR MARKER: AFP-TUMOR MARKER: 6 ng/mL (ref <=8)

## 2023-06-04 LAB — IRON & TIBC
IRON SATURATION: 52 % (ref 20–55)
IRON: 206 ug/dL — ABNORMAL HIGH
TOTAL IRON BINDING CAPACITY: 393 ug/dL (ref 250–425)

## 2023-06-04 LAB — FERRITIN: FERRITIN: 423.4 ng/mL — ABNORMAL HIGH

## 2023-06-04 MED ORDER — SOFOSBUVIR 400 MG-VELPATASVIR 100 MG TABLET
ORAL_TABLET | Freq: Every day | ORAL | 0 refills | 84 days | Status: CP
Start: 2023-06-04 — End: ?
  Filled 2023-06-09: qty 84, 84d supply, fill #0

## 2023-06-04 NOTE — Unmapped (Signed)
Counseling for HCV treatment     Tony Bautista. is a 42 y.o. presents to clinic with GF, Marcelino Duster, and is interested in starting HCV treatment. Reviewed treatment option with Epclusa. We discussed the prior authorization ([place]) process of obtaining the medication through insurance and that this may take some time.      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 Pending 06/04/23  Signs of liver decompensation? no  Previous treatment? no    Planned regimen: Epclusa (sofosbuvir/velpatasvir 400/100mg ) x 12 weeks  Urgency: Routine Request  Prescribing Provider/NPI: Dr. Gavin Potters / [id number]  Signature waiver form not obtained at this time.  Insurance: [employer] Medicaid    Current medications: aspirin 81mg , hydrochlorothiazide 12.5mg , Imdur 30mg , lisinopril, metoprolol    Following topics were discussed during counseling:    1. Indications for medication, dosage and administration.     A. Epclusa 400/100mg  1 tablet to take daily with or without food.      2. Common side effects of medications and management strategies. (fatigue, headache)      3. Importance of adherence to regimen, follow-up clinic visits and lab monitoring.     A.  Reviewed treatment follow up protocol including TW#4 and Post-TW#12 to assess for Bautista.      4. Drug-drug interaction.    A. Current medications have been reviewed and assessed for possible interaction.   - Denies antacid/heartburn medications. We discussed the mechanism of drug-drug interaction with acid lowering agents.   B. Advised to check with MD or pharmacist before taking any OTC/herbal medications, with emphasis regarding indigestion/heartburn medications.  Denies use of herbal medication such as milk thistle or [place] John's wart.  Allergies have been verified. Denies alcohol.     5. Importance of informing pharmacy and clinic of updated contact information.  Discussed the process of obtaining medication through specialty pharmacy and when approved medication will be delivered to patient's home. Stressed importance of being able to be reached over the phone.     6. Transmission: GF has not been screened - recommended 1x screening. Reviewed risk of reinfection after achieving Bautista if re-exposed to HCV virus.    Patient verbalized understanding. Provided contact information for any questions/concerns.       Park Breed, Pharm D., BCPS, BCGP, CPP  [facility]  [address]  [place], [place] [address]  [phone]    I spent a total of 20 minutes face to face with the patient delivering clinical care and providing education/counseling.

## 2023-06-04 NOTE — Unmapped (Signed)
[facility] Specialty Medication Onboarding    Specialty Medication: sofosbuvir-velpatasvir 400-100 mg tablet ([facility])  Prior Authorization: Not Required   Financial Assistance: No - copay  <$25  Final Copay/Day Supply: $4 / 84    Insurance Restrictions: None     Notes to Pharmacist:   Credit Card on File: no    The triage team has completed the benefits investigation and has determined that the patient is able to fill this medication at [facility]. Please contact the patient to complete the onboarding or follow up with the prescribing physician as needed.

## 2023-06-04 NOTE — Unmapped (Signed)
[facility]    Alba Destine, M.D.  Professor of Medicine  Division of GI and Hepatology  [place] [facility] [place] [facility] [place]    [phone]    [name]    Gildardo Pounds, PAC  [address]  [facility]  [place],  [place] [address]     Chief complaint: Patient is referred for consultation for chronic hepatitis C    Present illness: Patient is a 42 y.o. male with chronic hepatitis C that was diagnosed in 2021.  He was never treated.  He states he has been using injecting drugs for about 15 years but discontinued completely about 2 years ago.  He never had acute icteric hepatitis.  His major complaint is ongoing fatigue and occasional right upper quadrant pain.  The patient has been evaluated by his primary physician with the following results: Ultrasound (04/2023) and elastography  showed evidence of cirrhosis with 17.3 kPa.  The ultrasound did not show any liver masses but did show evidence of hepatic steatosis.  Other than the fatigue the patient generally feels well.  He denies nausea, vomiting, weight loss, skin rash.    10 system review of systems: Patient complains of dysphagia food getting stuck periodically.  He has chest pain which is being evaluated by cardiology.    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      Active Ambulatory Problems     Diagnosis Date Noted    No Active Ambulatory Problems     Resolved Ambulatory Problems     Diagnosis Date Noted    No Resolved Ambulatory Problems     Past Medical History:   Diagnosis Date    Hypertension     Hypoglycemia     Seizure (CMS-HCC)          Allergies   Allergen Reactions    Penicillins Anaphylaxis and Swelling     Has patient had a PCN reaction causing immediate rash, facial/tongue/throat swelling, SOB or lightheadedness with hypotension: yes, throat swelling   Has patient had a PCN reaction causing severe rash involving mucus membranes or skin necrosis: no   Has patient had a PCN reaction that required hospitalization: no   Has patient had a PCN reaction occurring within the last 10 years: no   If all of the above answers are NO, then may proceed with Cephalosporin use.    Tegretol [Carbamazepine] Shortness Of Breath    Aspirin      Depending on the brand of Aspirin.     Current Outpatient Medications   Medication Sig Dispense Refill    aspirin (ECOTRIN) 81 MG tablet Take 1 tablet (81 mg total) by mouth daily.      hydroCHLOROthiazide 12.5 MG tablet Take 1 tablet (12.5 mg total) by mouth.      ibuprofen (ADVIL,MOTRIN) 200 MG tablet Take 1 tablet (200 mg total) by mouth every six (6) hours as needed for pain.      isosorbide mononitrate (IMDUR) 30 MG 24 hr tablet Take 1 tablet (30 mg total) by mouth.      lisinopril (PRINIVIL,ZESTRIL) 20 MG tablet Take 1 tablet (20 mg total) by mouth daily.      metoPROLOL tartrate (LOPRESSOR) 50 MG tablet Take 1 tablet (50 mg total) by mouth daily at 0600.       No current facility-administered medications  for this visit.     Social history: The patient is self-employed doing Holiday representative work.  He lives with his girlfriend.  He smokes 1 pack/day.  He does not drink any alcohol.    Family history: Sister had fatty liver disease, uncle had hepatitis C and cirrhosis.    BP 136/92  - Pulse 76  - Temp 36.4 ??C (97.5 ??F) (Temporal)  - Ht 185.4 cm (6' 1)  - Wt (!) 108.8 kg (239 lb 12.8 oz)  - SpO2 94%  - BMI 31.64 kg/m??     Pleasant individual in NAD    HEENT: Sclera are anicteric, no temporal muscle loss  NECK: No thyromegaly or lymphadenopathy, No carotid bruits  Chest: Clear to auscultation and percussion  Heart: S1, S2, RR, No murmurs  Abdomen: Soft, non-tender, non-distended, no hepatosplenomegaly, no masses appreciated, no ascites  Skin: No spider angiomata, No rashes, extensive tattoos  Extremities: Without pedal edema, no palmar erythema  Neuro: Grossly intact, No focal deficits    Results for orders placed or performed in visit on 06/04/23   Iron Level and TIBC   Result Value Ref Range    Iron 206 (H) 65 - 175 ug/dL    TIBC 063 016 - 010 ug/dL    Iron Saturation (%) 52 20 - 55 %   Ferritin   Result Value Ref Range    Ferritin 423.4 (H) 10.5 - 307.3 ng/mL   AFP tumor marker   Result Value Ref Range    AFP-Tumor Marker 6 <=8 ng/mL   PT-INR   Result Value Ref Range    PT 11.8 9.9 - 12.6 sec    INR 1.06    Comprehensive Metabolic Panel   Result Value Ref Range    Sodium 137 135 - 145 mmol/L    Potassium 3.9 3.4 - 4.8 mmol/L    Chloride 105 98 - 107 mmol/L    CO2 25.7 20.0 - 31.0 mmol/L    Anion Gap 6 5 - 14 mmol/L    BUN 12 9 - 23 mg/dL    Creatinine 9.32 3.55 - 1.18 mg/dL    BUN/Creatinine Ratio 15     eGFR CKD-EPI (2021) Male >90 >=60 mL/min/1.79m2    Glucose 109 (H) 70 - 99 mg/dL    Calcium 9.6 8.7 - 73.2 mg/dL    Albumin 4.1 3.4 - 5.0 g/dL    Total Protein 7.9 5.7 - 8.2 g/dL    Total Bilirubin 1.3 (H) 0.3 - 1.2 mg/dL    AST 202 (H) <=54 U/L    ALT 315 (H) 10 - 49 U/L    Alkaline Phosphatase 65 46 - 116 U/L   CBC   Result Value Ref Range    WBC 8.0 3.6 - 11.2 10*9/L    RBC 6.07 (H) 4.26 - 5.60 10*12/L    HGB 18.2 (H) 12.9 - 16.5 g/dL    HCT 27.0 (H) 62.3 - 48.0 %    MCV 90.0 77.6 - 95.7 fL    MCH 30.0 25.9 - 32.4 pg    MCHC 33.3 32.0 - 36.0 g/dL    RDW 76.2 83.1 - 51.7 %    MPV 9.5 6.8 - 10.7 fL    Platelet 187 150 - 450 10*9/L         Impression:  1.  Chronic hepatitis C with evidence of cirrhosis.  This patient appears to have well compensated cirrhosis based upon the data available to [place] thus far. We discussed the naturally  history of cirrhosis including increased risk of liver failure, hepatic decompensation, liver cancer and need for liver transplantation.  We discussed that HCV can progress and ther fore continued monitoring is important. Patient should have EGD surveillance for esophageal varices if not performed recently. We also discussed the importance of hepatic imaging performed approximately every 6 months to screen for the development of HCC.  The patient will meet [facility] pharmacist, Erskine Squibb, to review treatment options.  The patient will be a good candidate for medications that have a high rate of cure and a low risk of side effects.    2.  Evaluate for portal hypertension: Patient also notes dysphagia so we will perform upper endoscopy to screen for esophageal varices as well as esophageal strictures.    3.  HCC surveillance: Recent ultrasound did not show evidence of mass.  I have reinforced with the patient the importance of screening for liver cancer every 6 months with liver ultrasound.    4.  Evidence of hepatic steatosis with elevated triglycerides: We discussed issues related to MASLD which is an independent risk factor for worsening of cirrhosis even after cure of hepatitis C.  The patient will follow-up with his primary care physician to discuss treatment for hyperlipidemia.    5.  History of chest pain: Recent CT angiogram with mild to moderate coronary artery disease.  Patient is scheduled for cardiology follow-up and ongoing evaluation.    6. Mildly elevated Ferritin and Fe Sat-may be related to underlying cirrhosis as levels not c/w hemochromatosis.  Check HFE gene at next visit.    Follow-up will be with Mliss Fritz, ANP.       Alba Destine, M.D., FAASLD  Professor of Medicine  Division of GI and Hepatology  [place] [facility] [place] [facility] [place]    [phone]

## 2023-06-04 NOTE — Unmapped (Addendum)
Chronic hepatitis C with cirrhosis. Today we will update labs. You will meet [facility] pharmacist, Erskine Squibb , to discuss treatment options. Today we discussed the natural history of HCV infection, including the risks for progression to liver failure, liver cancer, You need  need for continued follow-up and monitoring.  We discussed the importance of remaining abstinent from alcohol due to additive effects on disease progression to cirrhosis.  We discussed potential treatment options that include all-oral regimens with low rates of side effects and high rates of cure (sustained virological response). You need liver ultrasound every 6 months to screen for liver cancer for which you are at risk. We will also do upper endoscopy to look for dilated blood vessels and also investigate the food sticking in your throat. Follow-up with Annie Sable, ANP during treatment.  Follow-up with primary doctor to discuss treatment of the high triglycerides to decrease fatty liver disease.

## 2023-06-05 NOTE — Unmapped (Signed)
[facility]   Patient Onboarding/Medication Counseling    Mr. is a 42 y.o. male with Hepatitis C who I am counseling today on initiation of therapy.  I am speaking to the patient.    Was a Nurse, learning disability used for this call? No    Verified patient's date of birth / HIPAA.    Specialty medication(s) to be sent: Infectious Disease: sofosbuvir/velpatasvir      Non-specialty medications/supplies to be sent: n/a      Medications not needed at this time: n/a         Epclusa (sofosbuvir and velpatasvir) 400mg /100mg     The patient declined counseling on medication administration, missed dose instructions, goals of therapy, side effects and monitoring parameters, warnings and precautions, drug/food interactions, and storage, handling precautions, and disposal because they were counseled in clinic. The information in the declined sections below are for informational purposes only and was not discussed with patient.     Planned Start Date: 08/12 or 08/13    Has the patient been told to call and make a 4 week follow-up appointment after their medicine has been received? Yes, I told him to call Park Breed, CPP to schedule his treatment week 4 appointment.  He said he had her telephone number.      Medication & Administration     Dosage: Take one tablet by mouth daily for 12 weeks.    Administration:  Take with or without food.  Antacids: Separate the administration of Epclusa and antacids by at least 4 hours.  H2 Receptor Antagonist: Administer H2 receptor antagonist doses less than or comparable to famotidine 40 mg twice daily simultaneously or 12 hours apart from [place].   Proton Pump Inhibitor (PPI): Epclusa should be administered with food and taken 4 hours before omeprazole max dose of 20mg .      Adherence/Missed dose instructions:  Take missed dose as soon as you remember. If it is close to the time of your next dose, skip the missed dose and resume with your next scheduled dose.  Do not take extra doses or 2 doses at the same time.  Avoid missing doses as it can result in development of resistance.    Goals of Therapy     The goal of therapy is to cure the patient of Hepatitis C. Patients who have undetectable HCV RNA in the serum, as assessed by a sensitive polymerase chain reaction (PCR) assay, ?12 weeks after treatment completion are deemed to have achieved SVR (cure). (www.hcvguidelines.org)    Side Effects & Monitoring Parameters     Common Side Effects:   Headache  Fatigue     To help minimize some of the side effects: (http://www.velazquez.com/)  Stay hydrated by drinking plenty of water and limiting caffeinated beverages such as coffee, tea and soda.  Do your hardest chores when you have the most energy.  Take short naps of 20 minutes or less that are and not too close to bedtime.    The following side effects should be reported to the provider:  Signs of liver problems like dark urine, feeling excessively tired, lack of hunger, upset stomach or stomach pain, light-colored stools, throwing up, or yellow skin/eyes.  Signs of an allergic reaction, such as rash; hives; itching; red, swollen, blistered, or peeling skin with or without fever. If you have wheezing; tightness in the chest or throat; trouble breathing, swallowing, or talking; unusual hoarseness; or swelling of the mouth, face, lips, tongue, or throat, call 911 or  go to the closest emergency department (ED).     Monitoring Parameters: (AASLD-IDSA Guidelines)    Within 6 months prior to starting treatment:  Complete blood count (CBC).  International normalized ratio (INR) if clinically indicated.   Hepatic function panel: albumin, total and direct bilirubin, ALT, AST, and Alkaline Phosphatase levels.  Calculated glomerular filtration rate (eGFR).  Pregnancy test within 1 month of starting treatment for females of childbearing age  Any time prior to starting treatment:  Test for HCV genotype.  Assess for hepatic fibrosis.  Quantitative HCV RNA (HCV viral load).  Assess for active HBV coinfection: HBV surface antigen (HBsAg); If HBsAg positive, then should be assessed for whether HBV DNA level meets AASLD criteria for HBV treatment.  Assess for evidence of prior HBV infection and immunity: HBV core antibody (anti-HBc) and HBV surface antibody (anti-HBS).   Assess for HIV coinfection.  Test for the presence of resistance-associated substitutions (RASs) prior to starting treatment if clinically indicated.    Monitoring during treatment:   Diabetics should monitor for signs of hypoglycemia.  Patients on warfarin should monitor for changes in INR.  Pregnancy test monthly in females of childbearing age  For HBsAg positive patients not already on HBV therapy because baseline HBV DNA level does not meet treatment criteria:   Initiate prophylactic HBV antiviral therapy OR  Monitor HBV DNA levels monthly during and immediately after Epclusa therapy.    After treatment to document a cure:   Quantitative HCV RNA12 weeks after completion of therapy.    Contraindications, Warnings, & Precautions     Hepatitis B reactivation.  Bradycardia with amiodarone coadministration: Coadministration not recommended. If unavoidable, patients should have inpatient cardiac monitoring for 48 hours after first Epclusa dose, followed by daily outpatient or self-monitoring of heart rate for at least the first 2 weeks of treatment. Due to the long half-life of amiodarone, cardiac monitoring is also recommended if amiodarone was discontinued just prior to beginning treatment.     Drug/Food Interactions     Medication list reviewed in Epic. The patient was instructed to inform the care team before taking any new medications or supplements. No drug interactions identified.  Encourage minimizing alcohol intake: Denies alcohol  Antacids: Separate the administration of Epclusa and antacids by at least 4 hours.  H2 Receptor Antagonists: Administer H2 receptor antagonist doses less than or comparable to famotidine 40 mg twice daily simultaneously or 12 hours apart from [place].  Proton pump inhibitors: Not recommended. If medically necessary, Epclusa should be administered with food and taken 4 hours before omeprazole at a maximum dose of 20mg . Other proton pump inhibitors have not been studied.  HMG-CoA reductase inhibitors: monitor for signs of statin toxicity such as myopathy including rhabdomyolysis. Dose reduction of statin may be necessary. Ask patients to self-report potential side effects of increased concentrations such as muscle pain.  Rosuvastatin dose should not exceed 10mg .  Atorvastatin : Consider dose reduction if patient's dose is greater than 40mg .  Pravastatin: no clinically significant interaction.  Potential interactions: Clearance of HCV infection with direct acting antivirals may lead to changes in hepatic function, which may impact the safe and effective use of concomitant medications. Frequent monitoring of relevant laboratory parameters (e.g., International Normalized Ratio [INR] in patients taking warfarin, blood glucose levels in diabetic patients) or drug concentrations of concomitant medications such as cytochrome P450 substrates with a narrow therapeutic index (e.g. certain immunosuppressants) is recommended to ensure safe and effective use. Dose adjustments of concomitant medications may be necessary.  Storage, Handling Precautions, & Disposal     Store this medication in the original container at room temperature.  Store in a dry place. Do not store in a bathroom.  Keep all drugs in a safe place. Keep all drugs out of the reach of children and pets.  Disposal: You should NOT have any Epclusa left over to throw away      Current Medications (including OTC/herbals), Comorbidities and Allergies     Current Outpatient Medications   Medication Sig Dispense Refill    aspirin (ECOTRIN) 81 MG tablet Take 1 tablet (81 mg total) by mouth daily.      hydroCHLOROthiazide 12.5 MG tablet Take 1 tablet (12.5 mg total) by mouth.      ibuprofen (ADVIL,MOTRIN) 200 MG tablet Take 1 tablet (200 mg total) by mouth every six (6) hours as needed for pain.      isosorbide mononitrate (IMDUR) 30 MG 24 hr tablet Take 1 tablet (30 mg total) by mouth.      lisinopril (PRINIVIL,ZESTRIL) 20 MG tablet Take 1 tablet (20 mg total) by mouth daily.      metoPROLOL tartrate (LOPRESSOR) 50 MG tablet Take 1 tablet (50 mg total) by mouth daily at 0600.      sofosbuvir-velpatasvir (EPCLUSA) 400-100 mg tablet Take 1 tablet by mouth daily. 84 tablet 0     No current facility-administered medications for this visit.       Allergies   Allergen Reactions    Penicillins Anaphylaxis and Swelling     Has patient had a PCN reaction causing immediate rash, facial/tongue/throat swelling, SOB or lightheadedness with hypotension: yes, throat swelling   Has patient had a PCN reaction causing severe rash involving mucus membranes or skin necrosis: no   Has patient had a PCN reaction that required hospitalization: no   Has patient had a PCN reaction occurring within the last 10 years: no   If all of the above answers are NO, then may proceed with Cephalosporin use.    Tegretol [Carbamazepine] Shortness Of Breath    Aspirin      Depending on the brand of Aspirin.       There is no problem list on file for this patient.      Reviewed and up to date in Epic.    Appropriateness of Therapy     Acute infections noted within Epic:  No active infections  Patient reported infection: None    Is the medication and dose appropriate based on diagnosis, medication list, comorbidities, allergies, medical history, patient???s ability to self-administer the medication, and therapeutic goals? Yes    Prescription has been clinically reviewed: Yes      Baseline Quality of Life Assessment      How many days over the past month did your Hepatitis C  keep you from your normal activities? For example, brushing your teeth or getting up in the morning. 0    Financial Information     Medication Assistance provided: Prior Authorization    Anticipated copay of $4.00 reviewed with patient. Verified delivery address.    Delivery Information     Scheduled delivery date: 06/09/23    Expected start date: 08/12 or 08/13      Medication will be delivered via Same Day Courier to the prescription address in [facility].  This shipment will not require a signature.      Explained the services we provide at [facility] and that each month we would call to  set up refills.  Stressed importance of returning phone calls so that we could ensure they receive their medications in time each month.  Informed patient that we should be setting up refills 7-10 days prior to when they will run out of medication.  A pharmacist will reach out to perform a clinical assessment periodically.  Informed patient that a welcome packet, containing information about our pharmacy and other support services, a Notice of Privacy Practices, and a drug information handout will be sent.      The patient or caregiver noted above participated in the development of this care plan and knows that they can request review of or adjustments to the care plan at any time.      Patient or caregiver verbalized understanding of the above information as well as how to contact the pharmacy at [phone] option 4 with any questions/concerns.  The pharmacy is open Monday through Friday 8:30am-4:30pm.  A pharmacist is available 24/7 via pager to answer any clinical questions they may have.    Patient Specific Needs     Does the patient have any physical, cognitive, or cultural barriers? No    Does the patient have adequate living arrangements? (i.e. the ability to store and take their medication appropriately) Yes    Did you identify any home environmental safety or security hazards? No    Patient prefers to have medications discussed with  Patient     Is the patient or caregiver able to read and understand education materials at a high school level or above? Yes    Patient's primary language is  English     Is the patient high risk? No    SOCIAL DETERMINANTS OF HEALTH     At the [facility], we have learned that life circumstances - like trouble affording food, housing, utilities, or transportation can affect the health of many of our patients.   That is why we wanted to ask: are you currently experiencing any life circumstances that are negatively impacting your health and/or quality of life? Patient declined to answer    Social Determinants of Health     Financial Resource Strain: Not on file   Internet Connectivity: Not on file   Food Insecurity: Not on file   Tobacco Use: High Risk (06/04/2023)    Patient History     Smoking Tobacco Use: Every Day     Smokeless Tobacco Use: Unknown     Passive Exposure: Not on file   Housing/Utilities: Not on file   Alcohol Use: Not on file   Transportation Needs: Not on file   Substance Use: Not on file   Health Literacy: Not on file   Physical Activity: Not on file   Interpersonal Safety: Unknown (06/05/2023)    Interpersonal Safety     Unsafe Where You Currently Live: Not on file     Physically Hurt by Anyone: Not on file     Abused by Anyone: Not on file   Stress: Not on file   Intimate Partner Violence: Not on file   Depression: Not on file   Social Connections: Not on file       Would you be willing to receive help with any of the needs that you have identified today? Not applicable       Roderic Palau, PharmD  [facility] Specialty Pharmacist

## 2023-06-05 NOTE — Unmapped (Signed)
EGD  Procedure #1     Procedure #2   [id number]  MRN   Hepatologist   Endoscopist     Is the patient's health insurance [address], [place] [facility]), or [employer]?     Urgent procedure     Are you pregnant?     Are you in the process of scheduling or awaiting results of a heart ultrasound, stress test, or catheterization to evaluate new or worsening chest pain, dizziness, or shortness of breath?     Do you take: Plavix (clopidogrel), Coumadin (warfarin), Lovenox (enoxaparin), Pradaxa (dabigatran), Effient (prasugrel), Xarelto (rivaroxaban), Eliquis (apixaban), Pletal (cilostazol), or Brilinta (ticagrelor)?          Which of the above medications are you taking?          What is the name of the medical practice that manages this medication?          What is the name of the medical provider who manages this medication?     Do you have hemophilia, von Willebrand disease, or low platelets?     Do you have a pacemaker or implanted cardiac defibrillator?     Has a  GI provider specified the location(s)?     Which location(s) did the [facility] provider specify?        [facility]        [place]        [facility]        [facility]-Mod Sedation     Is procedure indication for variceal banding (this does NOT include variceal screening)?     Have you had a heart attack, stroke or heart stent placement within the past 6 months?     Month of event     Year of event (ONLY ENTER LAST 2 DIGITS)        6  Height (feet)   1  Height (inches)   238  Weight (pounds)   31.4  BMI          Did the ordering provider specify a bowel prep?          What bowel prep was specified?     Do you have chronic kidney disease?     Do you have chronic constipation or have you had poor quality bowel preps for past colonoscopies?     Do you have Crohn's disease or ulcerative colitis?     Have you had weight loss surgery?        TRUE  When you walk around your house or grocery store, do you have to stop and rest due to shortness of breath, chest pain, or light-headedness?     Do you ever use supplemental oxygen?     Have you been hospitalized for cirrhosis of the liver or heart failure in the last 12 months?     Have you been treated for mouth or throat cancer with radiation or surgery?     Have you been told that it is difficult for doctors to insert a breathing tube in you during anesthesia?     Have you had a heart or lung transplant?          Are you on dialysis?     Do you have cirrhosis of the liver?     Do you have myasthenia gravis?     Is the patient a prisoner?          Have you been diagnosed with sleep apnea or do you wear  a CPAP machine at night?     Are you younger than 30?     Have you previously received propofol sedation administered by an anesthesiologist for a GI procedure?     Do you drink an average of more than 3 drinks of alcohol per day?     Do you regularly take suboxone or any prescription medications for chronic pain?     Do you regularly take Ativan, Klonopin, Xanax, Valium, lorazepam, clonazepam, alprazolam, or diazepam?     Have you previously had difficulty with sedation during a GI procedure?     Have you been diagnosed with PTSD?     Are you allergic to fentanyl or midazolam (Versed)?     Do you take medications for HIV?   ################# ## ###################################################################################################################   MRN:          [id number]   Anticoag Review:  No   Nurse Triage:  No   GI Clinic Consult:  No   Procedure(s):  EGD     0   [facility]                  Endoscopist:  Hepatologist    Urgent:            No   Prep:                                 ################# ## ###################################################################################################################

## 2023-06-06 ENCOUNTER — Ambulatory Visit: Admit: 2023-06-06 | Discharge: 2023-06-06 | Payer: PRIVATE HEALTH INSURANCE

## 2023-06-06 ENCOUNTER — Encounter
Admit: 2023-06-06 | Discharge: 2023-06-06 | Payer: PRIVATE HEALTH INSURANCE | Attending: Certified Registered" | Primary: Certified Registered"

## 2023-06-06 LAB — HEPATITIS A IGG: HEPATITIS A IGG: NONREACTIVE

## 2023-06-06 LAB — HIV ANTIGEN/ANTIBODY COMBO: HIV ANTIGEN/ANTIBODY COMBO: NONREACTIVE

## 2023-06-06 LAB — HEPATITIS B SURFACE ANTIBODY
HEPATITIS B SURFACE ANTIBODY QUANT: <8 m[IU]/mL (ref <8.00)
HEPATITIS B SURFACE ANTIBODY: NONREACTIVE

## 2023-06-06 LAB — HEPATITIS B SURFACE ANTIGEN: HEPATITIS B SURFACE ANTIGEN: NONREACTIVE

## 2023-06-06 LAB — HEPATITIS B CORE ANTIBODY, TOTAL: HEPATITIS B CORE TOTAL ANTIBODY: NONREACTIVE

## 2023-06-06 MED ADMIN — Propofol (DIPRIVAN) injection: INTRAVENOUS | @ 15:00:00 | Stop: 2023-06-06

## 2023-06-06 MED ADMIN — ketamine (KETALAR) injection: INTRAVENOUS | @ 15:00:00 | Stop: 2023-06-06

## 2023-06-06 MED ADMIN — ePHEDrine (PF) 25 mg/5 mL (5 mg/mL) in 0.9% sodium chloride syringe Syrg: INTRAVENOUS | @ 15:00:00 | Stop: 2023-06-06

## 2023-06-06 MED ADMIN — phenylephrine 1 mg/10 mL (100 mcg/mL) injection Syrg: INTRAVENOUS | @ 15:00:00 | Stop: 2023-06-06

## 2023-06-06 MED ADMIN — lidocaine (PF) (XYLOCAINE-MPF) 20 mg/mL (2 %) injection: INTRAVENOUS | @ 15:00:00 | Stop: 2023-06-06

## 2023-06-06 MED ADMIN — fentaNYL (PF) (SUBLIMAZE) injection: INTRAVENOUS | @ 15:00:00 | Stop: 2023-06-06

## 2023-06-06 MED ADMIN — lactated Ringers infusion: 10 mL/h | INTRAVENOUS | @ 14:00:00 | Stop: 2023-06-06

## 2023-06-09 NOTE — Unmapped (Signed)
Patient called to let us know that he received his Hep C medication Dorita Fray) today and will be starting it tomorrow morning (06/10/23).    Patient said he was told to call and give [place] this information. I thanked him for calling and told him that I would let our pharmacist & pharmacy know.    Lanora Manis

## 2023-06-12 DIAGNOSIS — M549 Dorsalgia, unspecified: Principal | ICD-10-CM

## 2023-07-03 ENCOUNTER — Institutional Professional Consult (permissible substitution)
Admit: 2023-07-03 | Discharge: 2023-07-04 | Payer: PRIVATE HEALTH INSURANCE | Attending: Pharmacist Clinician (PhC)/ Clinical Pharmacy Specialist | Primary: Pharmacist Clinician (PhC)/ Clinical Pharmacy Specialist

## 2023-07-03 NOTE — Unmapped (Signed)
Tony Bautista  Primary Hepatologist: Dr. Loleta Books of [facility] at [facility]  [phone]    Referring MD: Unknown Foley, [facility]    Reason For Office Visit: Follow up for Treatment of Hepatitis C  Regimen: Epclusa x 12 weeks  Start date: 06/10/23  TW# 4    History of Present Illness:  Tony Bautista. is a 42 y.o. male who follows up via phone for treatment of chronic hepatitis C. He was diagnosed in 2021. He was never treated. He states he has been using injecting drugs for about 15 years but discontinued completely about 2 years ago.     Pt started Epclusa on 06/10/23. Takes 1 tablet daily around 6:15am. Has pill box to set up to help with adherence. Denies any missed doses. Denies headaches. Denies heartburn medications/herbal supplements/alcohol. Reports his cardiologist (Dr. Juliann Pares from [facility]) has been adjusting his BP meds. (Recently added furosemide and spironolactone).     Liver History:   1. Chronic hepatitis C, treatment naie, genotype 1a (External Rec 04/11/23 Labs)   Baseline HCV RNA 981,000 IU/ml on 04/08/23  2. Immunity hepatitis A:  IgG nonreactive 06/04/23  3. Immunity hepatitis B: Hb S Ag nonreactive, Hb S Ab nonreactive, Hb Core Ab nonreactive 06/04/23  4. HIV status: nonreactive 06/04/23  5. 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) per [place] elastography 05/20/23    Cirrhosis Care:  ** HCC screening: [place] elatography on 05/20/23 - No masses, + steatosis   ** Variceal screening: 06/06/23: small (<79mm) esophageal varices  ** Child Pugh: 5, MELD: MELD 3.0: 8 at 06/04/2023 11:09 AM  MELD-Na: 8 at 06/04/2023 11:09 AM  Calculated from:  Serum Creatinine: 0.81 mg/dL (Using min of 1 mg/dL) at 10/31/863 78:46 AM  Serum Sodium: 137 mmol/L at 06/04/2023 11:09 AM  Total Bilirubin: 1.3 mg/dL at 07/04/2951 84:13 AM  Serum Albumin: 4.1 g/dL (Using max of 3.5 g/dL) at 12/01/4008 27:25 AM  INR(ratio): 1.06 at 06/04/2023 11:09 AM  Age at listing (hypothetical): 41 years  Sex: Male at 06/04/2023 11:09 AM    PMH/PSH:  Past Medical History:   Diagnosis Date    Arthritis     inflammatory rib cage disease/bursitis    Hypertension     Hypoglycemia     Seizure (CMS-HCC)      Past Surgical History:   Procedure Laterality Date    PR UPPER GI ENDOSCOPY,BIOPSY N/A 06/06/2023    Procedure: UGI ENDOSCOPY; WITH BIOPSY, SINGLE OR MULTIPLE;  Surgeon: Mickie Hillier, MD;  [facility];  Service: Gastroenterology       Allergies   Allergen Reactions    Penicillins Anaphylaxis and Swelling     Has patient had a PCN reaction causing immediate rash, facial/tongue/throat swelling, SOB or lightheadedness with hypotension: yes, throat swelling   Has patient had a PCN reaction causing severe rash involving mucus membranes or skin necrosis: no   Has patient had a PCN reaction that required hospitalization: no   Has patient had a PCN reaction occurring within the last 10 years: no   If all of the above answers are NO, then may proceed with Cephalosporin use.    Tegretol [Carbamazepine] Shortness Of Breath    Aspirin      Depending on the brand of Aspirin.       Current Medications:  Current Outpatient Medications   Medication Sig Dispense Refill    aspirin (ECOTRIN) 81 MG tablet Take 1 tablet (81 mg total) by mouth  daily.      furosemide (LASIX) 20 MG tablet Take 1 tablet (20 mg total) by mouth daily.      hydroCHLOROthiazide 12.5 MG tablet Take 1 tablet (12.5 mg total) by mouth.      isosorbide mononitrate (IMDUR) 30 MG 24 hr tablet Take 1 tablet (30 mg total) by mouth.      lisinopril (PRINIVIL,ZESTRIL) 20 MG tablet Take 2 tablets (40 mg total) by mouth daily.      metoPROLOL tartrate (LOPRESSOR) 50 MG tablet Take 1 tablet (50 mg total) by mouth daily at 0600.      sofosbuvir-velpatasvir (EPCLUSA) 400-100 mg tablet Take 1 tablet by mouth daily. 84 tablet 0    spironolactone (ALDACTONE) 25 MG tablet Take 1 tablet (25 mg total) by mouth daily.      ibuprofen (ADVIL,MOTRIN) 200 MG tablet Take 1 tablet (200 mg total) by mouth every six (6) hours as needed for pain.       No current facility-administered medications for this visit.       Social History  Social History     Tobacco Use    Smoking status: Every Day     Types: Cigarettes     Start date: 05/28/1992   Substance Use Topics    Alcohol use: Not Currently    Drug use: No     Comment: quit 2 1/2 years ago       Family History:  No family history on file.      Physical Examination:  Not performed as this was a phone visit    Lab:  Results for orders placed or performed during the hospital encounter of 06/06/23   Surgical pathology exam   Result Value Ref Range    Diagnosis       A: Stomach, biopsy:  - Gastric antral mucosa with reactive epithelial changes, mild lamina propria capillary congestion and a focus of intestinal metaplasia, complete type.  -Negative for Helicobacter organisms on H&E stain.    B: Esophagus, biopsy:  - Gastric cardia type mucosa with mild chronic inactive carditis, negative for intestinal metaplasia.  - Negative for Helicobacter organisms on H&E stain.  - No esophageal squamous mucosa is present.      This electronic signature is attestation that the pathologist personally reviewed the submitted material(s) and the final diagnosis reflects that evaluation.      Clinical History       42 year old male with a history of cirrhosis and chronic HCV has an EGD to evaluate for dysphagia, strictures, and esophageal varices. Endoscopy revealed small esophageal varices, salmon-colored mucosa suspicious for short-segment Barrett's esophagus that was biopsied, gastric antral vascular ectasia without bleeding, and two mucosal papules (nodules) in the stomach.        Gross Description       Specimen A:  Site:   Gastric  Method:  Biopsy  Measurement: 4 x 3 x 3 mm  Comment:  Multiple fragments of tan mucosa  Block:   A1, NTR  Specimen B:  Site:   Esophagus, GE junction  Method:  Biopsy  Measurement: 3 x 3 x 2 mm  Comment:  One fragment of tan mucosa  Block:   B1, NTR    (Tony Bautista)      Microscopic Description       Microscopic examination substantiates the above diagnosis.      Disclaimer       Unless otherwise specified, specimens are preserved using 10% neutral buffered formalin. For cases  in which immunohistochemical and/or in-situ hybridization stains are performed, the following statement applies: Appropriate controls for each stain (positive controls with or without negative controls) have been evaluated and stain as expected. These stains have not been separately validated for use on decalcified specimens and should be interpreted with caution in that setting. Some of the reagents used for these stains may be classified as analyte specific reagents (ASR). Tests using ASRs were developed, and their performance characteristics were determined, by the Anatomic Pathology Department [facility]). They have not been cleared or approved by the [place] Food and Drug Administration (FDA). The FDA does not require these tests to go through premarket FDA review. These tests are used for clinical purposes. They should not be regarded as investigational or for research. This laboratory is certified under the Clinical Laboratory Improvement Amendments (CLIA) as qualified to perform high complexity clinical laboratory testing.      EMBEDDED IMAGES           Assessment:   Tony Bautista. is a 42 y.o. with chronic hepatitis C, genotype 1a. He follows up via phone after starting Epclusa treatment on 06/10/23. He has been 100% adherent to regimen thus far. Tolerating treatment with no complaints.    Stressed importance of continued adherence. Reviewed that there is still a small chance of relapse after finishing the treatment. Discussed the importance of follow up 12 weeks after finishing treatment medications to check for SVR (Bautista).  We discussed potential retreatment options in the rare event that post-TW#12 lab results indicated relapse.  We also discussed the importance of continued cirrhosis care and importance of ongoing surveillance for [facility].      Plan:   **Continue Epclusa for total of 12 weeks. Anticipated end date: 09/01/23  **Vaccination: nonimmune to HAV and HBV. Recommend vacciantion  **HCC Screening: up to date. Due 10/2023  **Variceal screening: small esophageal varices. Consider switching metoprolol to carvedilol. Will discuss with Dr. Foy Guadalajara.  **Follow up around post-TW#12 with Annie Sable, NP. Anticipated P-TW#12= 11/25/23    Park Breed, Pharm D., BCPS, BCGP, CPP  [facility]  [address]  [place], [place] [address]  [phone]

## 2023-07-12 NOTE — Unmapped (Signed)
[facility] Clinical Assessment & Refill Coordination Note    Tony Bautista., DOB:   Phone: [phone] (home)     All above HIPAA information was verified with patient.     Was a Nurse, learning disability used for this call? No    Specialty Medication(s):   Infectious Disease: sofosbuvir/velpatasvir     Current Outpatient Medications   Medication Sig Dispense Refill    aspirin (ECOTRIN) 81 MG tablet Take 1 tablet (81 mg total) by mouth daily.      furosemide (LASIX) 20 MG tablet Take 1 tablet (20 mg total) by mouth daily.      hydroCHLOROthiazide 12.5 MG tablet Take 1 tablet (12.5 mg total) by mouth.      ibuprofen (ADVIL,MOTRIN) 200 MG tablet Take 1 tablet (200 mg total) by mouth every six (6) hours as needed for pain.      isosorbide mononitrate (IMDUR) 30 MG 24 hr tablet Take 1 tablet (30 mg total) by mouth.      lisinopril (PRINIVIL,ZESTRIL) 20 MG tablet Take 2 tablets (40 mg total) by mouth daily.      metformin HCl (METFORMIN ORAL) Take by mouth.      metoPROLOL tartrate (LOPRESSOR) 50 MG tablet Take 1 tablet (50 mg total) by mouth daily at 0600.      sofosbuvir-velpatasvir (EPCLUSA) 400-100 mg tablet Take 1 tablet by mouth daily. 84 tablet 0    spironolactone (ALDACTONE) 25 MG tablet Take 1 tablet (25 mg total) by mouth daily.       No current facility-administered medications for this visit.        Changes to medications: Tony Bautista reports starting the following medications: furosemide, spironolactone and metformin    Allergies   Allergen Reactions    Penicillins Anaphylaxis and Swelling     Has patient had a PCN reaction causing immediate rash, facial/tongue/throat swelling, SOB or lightheadedness with hypotension: yes, throat swelling   Has patient had a PCN reaction causing severe rash involving mucus membranes or skin necrosis: no   Has patient had a PCN reaction that required hospitalization: no   Has patient had a PCN reaction occurring within the last 10 years: no   If all of the above answers are NO, then may proceed with Cephalosporin use.    Tegretol [Carbamazepine] Shortness Of Breath    Aspirin      Depending on the brand of Aspirin.       Changes to allergies: No    SPECIALTY MEDICATION ADHERENCE     Sofosbuvir-velpatasvir 400-100 mg: 52 days of medicine on hand     Medication Adherence    Patient reported X missed doses in the last month: 0  Specialty Medication: sofosbuvir-velpatasvir (EPCLUSA) 400-100 mg tablet  Patient is on additional specialty medications: No  Demonstrates understanding of importance of adherence: yes  Informant: patient  Provider-estimated medication adherence level: good  Patient is at risk for Non-Adherence: No          Specialty medication(s) dose(s) confirmed: Regimen is correct and unchanged.     Are there any concerns with adherence? No    Adherence counseling provided? Not needed    CLINICAL MANAGEMENT AND INTERVENTION      Clinical Benefit Assessment:    Do you feel the medicine is effective or helping your condition? Yes    Clinical Benefit counseling provided? Not needed    Adverse Effects Assessment:    Are you experiencing any side effects? No  Are you experiencing difficulty administering your medicine? No    Quality of Life Assessment:    How many days over the past month did your Hepatitits C  keep you from your normal activities? For example, brushing your teeth or getting up in the morning. 0    Have you discussed this with your provider? Not needed    Acute Infection Status:    Acute infections noted within Epic:  No active infections  Patient reported infection: None    Therapy Appropriateness:    Is therapy appropriate based on current medication list, adverse reactions, adherence, clinical benefit and progress toward achieving therapeutic goals? Yes, therapy is appropriate and should be continued     DISEASE/MEDICATION-SPECIFIC INFORMATION      For Hepatitis C patients (clinical assessment):  Regimen: sofosbuvir-velpatasvir x 12 weeks  Therapy start date: 06/10/23  Completed Treatment Week #: 4  What time of day do you take your medicine? Morning  Pill count over the phone revealed #52 tablets which is appropriate.    Are you taking any new OTC or herbal medication? No  Any alcoholic beverages? No  Do you have a follow up appointment? Yes, he had his follow-up appointment on 07/03/23    Hepatitis C: Not Applicable    PATIENT SPECIFIC NEEDS     Does the patient have any physical, cognitive, or cultural barriers? No    Is the patient high risk? No    Did the patient require a clinical intervention? No    Does the patient require physician intervention or other additional services (i.e., nutrition, smoking cessation, social work)? No    SOCIAL DETERMINANTS OF HEALTH     At the [facility], we have learned that life circumstances - like trouble affording food, housing, utilities, or transportation can affect the health of many of our patients.   That is why we wanted to ask: are you currently experiencing any life circumstances that are negatively impacting your health and/or quality of life? Patient declined to answer    Social Determinants of Health     Financial Resource Strain: Not on file   Internet Connectivity: Not on file   Food Insecurity: Not on file   Tobacco Use: High Risk (06/11/2023)    Received from [facility]    Patient History     Smoking Tobacco Use: Every Day     Smokeless Tobacco Use: Current     Passive Exposure: Not on file   Housing/Utilities: Not on file   Alcohol Use: Not on file   Transportation Needs: Not on file   Substance Use: Not on file   Health Literacy: Not on file   Physical Activity: Not on file   Interpersonal Safety: Unknown (07/11/2023)    Interpersonal Safety     Unsafe Where You Currently Live: Not on file     Physically Hurt by Anyone: Not on file     Abused by Anyone: Not on file   Stress: Not on file   Intimate Partner Violence: Not on file   Depression: Not on file   Social Connections: Not on file       Would you be willing to receive help with any of the needs that you have identified today? Not applicable       SHIPPING     Specialty Medication(s) to be Shipped:   Infectious Disease: He does not need a refill of his sofosbuvir-velpatasvir.  He received the full 84 day course on  the first fill.    Other medication(s) to be shipped: No additional medications requested for fill at this time     Changes to insurance: No    Delivery Scheduled:  No        The patient will receive a drug information handout for each medication shipped and additional FDA Medication Guides as required.  Verified that patient has previously received a Conservation officer, historic buildings and a Surveyor, mining.    The patient or caregiver noted above participated in the development of this care plan and knows that they can request review of or adjustments to the care plan at any time.      All of the patient's questions and concerns have been addressed.    Roderic Palau, PharmD   [facility] Specialty Pharmacist

## 2023-07-12 NOTE — Unmapped (Signed)
Specialty Medication(s): sofosbuvir-velpatasvir 400-100mg     Mr. has been dis-enrolled from the [facility] specialty pharmacy services due to therapy completion - expected therapy completion date: 09/01/23 .    Additional information provided to the patient: n/a    Roderic Palau, PharmD  [facility] Specialty Pharmacist

## 2023-07-21 ENCOUNTER — Ambulatory Visit
Admit: 2023-07-21 | Discharge: 2023-07-22 | Payer: PRIVATE HEALTH INSURANCE | Attending: Anesthesiology | Primary: Anesthesiology

## 2023-07-21 DIAGNOSIS — F17200 Nicotine dependence, unspecified, uncomplicated: Principal | ICD-10-CM

## 2023-07-21 DIAGNOSIS — M549 Dorsalgia, unspecified: Principal | ICD-10-CM

## 2023-07-21 DIAGNOSIS — M792 Neuralgia and neuritis, unspecified: Principal | ICD-10-CM

## 2023-07-21 DIAGNOSIS — G8929 Other chronic pain: Principal | ICD-10-CM

## 2023-07-21 DIAGNOSIS — M5441 Lumbago with sciatica, right side: Principal | ICD-10-CM

## 2023-07-21 DIAGNOSIS — M67911 Unspecified disorder of synovium and tendon, right shoulder: Principal | ICD-10-CM

## 2023-07-21 DIAGNOSIS — M5412 Radiculopathy, cervical region: Principal | ICD-10-CM

## 2023-07-21 MED ORDER — GABAPENTIN 300 MG CAPSULE
ORAL_CAPSULE | Freq: Three times a day (TID) | ORAL | 2 refills | 30 days | Status: CP
Start: 2023-07-21 — End: ?

## 2023-07-28 ENCOUNTER — Ambulatory Visit: Payer: Medicaid Other | Attending: Otolaryngology

## 2023-07-28 DIAGNOSIS — I493 Ventricular premature depolarization: Secondary | ICD-10-CM | POA: Diagnosis not present

## 2023-07-28 DIAGNOSIS — R0902 Hypoxemia: Secondary | ICD-10-CM | POA: Insufficient documentation

## 2023-07-28 DIAGNOSIS — G4761 Periodic limb movement disorder: Secondary | ICD-10-CM | POA: Diagnosis not present

## 2023-07-30 DIAGNOSIS — M5441 Lumbago with sciatica, right side: Principal | ICD-10-CM

## 2023-07-30 DIAGNOSIS — G8929 Other chronic pain: Principal | ICD-10-CM

## 2023-07-30 DIAGNOSIS — M5412 Radiculopathy, cervical region: Principal | ICD-10-CM

## 2023-08-08 ENCOUNTER — Ambulatory Visit: Admit: 2023-08-08 | Discharge: 2023-08-09

## 2023-09-07 ENCOUNTER — Emergency Department
Admission: EM | Admit: 2023-09-07 | Discharge: 2023-09-07 | Disposition: A | Discharge status: Home / Self Care | Payer: Medicaid Other | Attending: Emergency Medicine | Admitting: Emergency Medicine

## 2023-09-07 ENCOUNTER — Other Ambulatory Visit: Payer: Self-pay

## 2023-09-07 DIAGNOSIS — M5441 Lumbago with sciatica, right side: Secondary | ICD-10-CM | POA: Insufficient documentation

## 2023-09-07 DIAGNOSIS — M545 Low back pain, unspecified: Secondary | ICD-10-CM | POA: Diagnosis present

## 2023-09-07 DIAGNOSIS — M5431 Sciatica, right side: Secondary | ICD-10-CM

## 2023-09-07 MED ORDER — LIDOCAINE 5 % EX PTCH: 1.0000 | MEDICATED_PATCH | Freq: Two times a day (BID) | CUTANEOUS | 0 refills | Status: AC

## 2023-09-07 MED ORDER — DEXAMETHASONE SODIUM PHOSPHATE 10 MG/ML IJ SOLN
10.0000 mg | Freq: Once | INTRAMUSCULAR | Status: AC
  Administered 2023-09-07: 10 mg via INTRAMUSCULAR
  Filled 2023-09-07: qty 1

## 2023-09-07 MED ORDER — PREDNISONE 10 MG PO TABS: ORAL_TABLET | ORAL | 0 refills | Status: AC

## 2023-09-07 MED ORDER — LIDOCAINE 5 % EX PTCH
1.0000 | MEDICATED_PATCH | CUTANEOUS | Status: DC
  Administered 2023-09-07: 1 via TRANSDERMAL
  Filled 2023-09-07: qty 1

## 2023-09-07 NOTE — Discharge Instructions (Signed)
Call make follow-up appoint with your doctor at the pain center at [facility].  A prescription for prednisone and Lidoderm patches was sent to the pharmacy for you to begin taking.

## 2023-09-07 NOTE — ED Notes (Signed)
See triage note  Presents with lower back pain  States the pain is on the right side  Moves into right leg  Describes as numbness to anterior part os his leg  and moves into feet  Ambulates well to treatment Denies any bowel or bladder issues

## 2023-09-07 NOTE — ED Provider Notes (Signed)
[facility] Provider Note    Event Date/Time   First MD Initiated Contact with Patient 09/07/23 [phone]     (approximate)   History   Back Pain   HPI  Isaiah Green. is a 42 y.o. male   presents to the ED with complaint of low back pain with radiation into his right lower extremity.  Has a history of back problems and is currently seeing the [facility] pain center where he is scheduled to have an MRI in the future.  Patient currently has taken gabapentin 3 times daily without relief of his symptoms.  He denies any incontinence of bowel or bladder and continues to ambulate without any assistance.  Patient also has history of hepatitis C, Lyme's disease, hypertension, polysubstance abuse.      Physical Exam   Triage Vital Signs: ED Triage Vitals [09/07/23 0730]  Encounter Vitals Group     BP (!) 142/96     Systolic BP Percentile      Diastolic BP Percentile      Pulse Rate 75     Resp 18     Temp 98 F (36.7 C)     Temp src      SpO2 100 %     Weight 240 lb (108.9 kg)     Height 6\' 1"  (1.854 m)     Head Circumference      Peak Flow      Pain Score 9     Pain Loc      Pain Education      Exclude from Growth Chart     Most recent vital signs: Vitals:   09/07/23 0730  BP: (!) 142/96  Pulse: 75  Resp: 18  Temp: 98 F (36.7 C)  SpO2: 100%     General: Awake, no distress.  CV:  Good peripheral perfusion.  Heart regular rate and rhythm. Resp:  Normal effort.  Lungs clear bilaterally. Abd:  No distention.  Other:  Moderate tenderness on palpation of the lower lumbar and right SI joint area and surrounding tissue.  Range of motion is slow and guarded.  Patient is able to stand and ambulate without any assistance.  He is able to lift his legs individually while lying supine without any difficulty.  Good muscle strength at 5/5.  Reflexes are 2+ bilaterally.   ED Results / Procedures / Treatments   Labs (all labs ordered are listed, but only  abnormal results are displayed) Labs Reviewed - No data to display    PROCEDURES:  Critical Care performed:   Procedures   MEDICATIONS ORDERED IN ED: Medications  lidocaine (LIDODERM) 5 % 1 patch (1 patch Transdermal Patch Applied 09/07/23 0800)  dexamethasone (DECADRON) injection 10 mg (10 mg Intramuscular Given 09/07/23 0800)     IMPRESSION / MDM / ASSESSMENT AND PLAN / ED COURSE  I reviewed the triage vital signs and the nursing notes.   Differential diagnosis includes, but is not limited to, low back pain with right lower extremity sciatica, chronic back pain, musculoskeletal pain, muscle skeletal strain, cauda equina on considered but no symptoms to suggest.  42 year old male presents to the ED with complaint of low back pain with radiation into his right lower extremity.  Patient has been going to the pain center at [facility] where an MRI has been ordered to further evaluate his pain.  Physical exam is consistent with sciatica.  Patient was given Decadron 10 mg IM and a Lidoderm patch while  in the ED.  A prescription for prednisone and Lidoderm patches was sent to the pharmacy for him to continue taking.  Instructed to call and make an appointment with his doctor at the pain clinic for follow-up.  Patient was ambulatory at the time of discharge.      Patient's presentation is most consistent with exacerbation of chronic illness.  FINAL CLINICAL IMPRESSION(S) / ED DIAGNOSES   Final diagnoses:  Sciatica of right side     Rx / DC Orders   ED Discharge Orders          Ordered    predniSONE (DELTASONE) 10 MG tablet        09/07/23 0808    lidocaine (LIDODERM) 5 %  Every 12 hours        09/07/23 0808             Note:  This document was prepared using Dragon voice recognition software and may include unintentional dictation errors.   Tommi Rumps, PA-C 09/07/23 1041    Corena Herter, MD 09/09/23 [phone]
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
## 2023-09-10 ENCOUNTER — Ambulatory Visit: Admit: 2023-09-10 | Discharge: 2023-09-11 | Payer: PRIVATE HEALTH INSURANCE

## 2023-09-10 MED ORDER — LIDOCAINE 5 % TOPICAL OINTMENT
0 refills | 0 days | Status: CP
Start: 2023-09-10 — End: ?

## 2023-10-20 ENCOUNTER — Ambulatory Visit
Admit: 2023-10-20 | Discharge: 2023-10-21 | Payer: PRIVATE HEALTH INSURANCE | Attending: Anesthesiology | Primary: Anesthesiology

## 2023-10-20 DIAGNOSIS — M792 Neuralgia and neuritis, unspecified: Principal | ICD-10-CM

## 2023-10-20 DIAGNOSIS — M5441 Lumbago with sciatica, right side: Principal | ICD-10-CM

## 2023-10-20 DIAGNOSIS — M5416 Radiculopathy, lumbar region: Principal | ICD-10-CM

## 2023-10-20 DIAGNOSIS — G894 Chronic pain syndrome: Principal | ICD-10-CM

## 2023-10-20 DIAGNOSIS — G8929 Other chronic pain: Principal | ICD-10-CM

## 2023-10-20 MED ORDER — GABAPENTIN 300 MG CAPSULE
ORAL_CAPSULE | Freq: Every evening | ORAL | 2 refills | 30.00 days | Status: CP
Start: 2023-10-20 — End: 2024-01-18

## 2023-10-20 MED ORDER — LIDOCAINE 5 % TOPICAL OINTMENT
0 refills | 0.00 days | Status: CN
Start: 2023-10-20 — End: ?

## 2023-10-27 ENCOUNTER — Ambulatory Visit
Admission: EM | Admit: 2023-10-27 | Discharge: 2023-10-27 | Disposition: A | Discharge status: Home / Self Care | Payer: Medicaid Other | Attending: Physician Assistant | Admitting: Physician Assistant

## 2023-10-27 DIAGNOSIS — R509 Fever, unspecified: Secondary | ICD-10-CM | POA: Diagnosis present

## 2023-10-27 DIAGNOSIS — R051 Acute cough: Secondary | ICD-10-CM | POA: Insufficient documentation

## 2023-10-27 DIAGNOSIS — B349 Viral infection, unspecified: Secondary | ICD-10-CM | POA: Diagnosis present

## 2023-10-27 LAB — RESP PANEL BY RT-PCR (FLU A&B, COVID) ARPGX2
Influenza A by PCR: POSITIVE — AB
Influenza B by PCR: NEGATIVE
SARS Coronavirus 2 by RT PCR: NEGATIVE

## 2023-10-27 MED ORDER — PROMETHAZINE-DM 6.25-15 MG/5ML PO SYRP
5.0000 mL | ORAL_SOLUTION | Freq: Four times a day (QID) | ORAL | 0 refills | Status: AC | PRN
Start: 2023-10-27 — End: ?

## 2023-10-27 MED ORDER — OSELTAMIVIR PHOSPHATE 75 MG PO CAPS: 75.0000 mg | ORAL_CAPSULE | Freq: Two times a day (BID) | ORAL | 0 refills | Status: AC

## 2023-10-27 NOTE — ED Triage Notes (Signed)
Pt reports fever, cough, fatigue x 24 hours.  Took Nyquil overnight. States his partner is ill and has been around a lot of coworkers with the flu.

## 2023-10-27 NOTE — ED Provider Notes (Signed)
[facility]    CSN: [id number] Arrival date & time: 10/27/23  1126      History   Chief Complaint Chief Complaint  Patient presents with   Cough   Fever    HPI Isaiah Hoag. is a 42 y.o. male presenting for fever, fatigue, body aches, headaches, cough, ingestion and runny nose that began in the past 24 hours.  He says his entire household has the flu and he believes he has the flu as well.  He reports a history of heart issues and does not want to get sicker.  He has taken TheraFlu over-the-counter.  He is not reporting any chest pain, wheezing, shortness of breath.  He does report using oxygen at nighttime because of his heart issue.  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 Authorizing Provider  lisinopril-hydrochlorothiazide (ZESTORETIC) 10-12.5 MG tablet Take 1 tablet by mouth daily.   Yes [provider]  oseltamivir (TAMIFLU) 75 MG capsule Take 1 capsule (75 mg total) by mouth every 12 (twelve) hours for 5 days. 10/27/23 11/01/23 Yes Shirlee Latch, PA-C  promethazine-dextromethorphan (PROMETHAZINE-DM) 6.25-15 MG/5ML syrup Take 5 mLs by mouth 4 (four) times daily as needed. 10/27/23  Yes Shirlee Latch, PA-C  diclofenac (VOLTAREN) 50 MG EC tablet Take 1  tablet (50 mg total) by mouth 2 (two) times daily. 02/21/18  Yes Merrily Brittle, MD  furosemide (LASIX) 40 MG tablet Take 40 mg by mouth.   Yes [provider]  gabapentin (NEURONTIN) 300 MG capsule Take 300 mg by mouth 3 (three) times daily.    [provider]  lidocaine (LIDODERM) 5 % Place 1 patch onto the skin every 12 (twelve) hours. Remove & Discard patch within 12 hours or as directed by MD 09/07/23 09/06/24  Tommi Rumps, PA-C  metFORMIN (GLUCOPHAGE-XR) 500 MG 24 hr tablet Take 500 mg by mouth daily with breakfast.    [provider]  metoprolol tartrate (LOPRESSOR) 100 MG tablet Take 100 mg by mouth 2 (two) times daily.   Yes [provider]  predniSONE (DELTASONE) 10 MG tablet Take 6 tablets  today, on day 2 take 5 tablets, day 3 take 4 tablets, day 4 take 3 tablets, day 5 take  2 tablets and 1 tablet the last day 09/07/23   Tommi Rumps, PA-C    Family History Family History  Problem Relation Age of Onset   Diabetes Mellitus II Mother    Diabetes Mellitus II Sister    Hypertension Sister     Social History Social History   Tobacco Use   Smoking status: Every Day    Current  packs/day: 1.00    Types: Cigars, Cigarettes   Smokeless tobacco: Never  Substance Use Topics   Alcohol use: No   Drug use: Not Currently    Types: IV    Comment: heroin, meth, cocaine     Allergies   Aspirin, Penicillins, and Tegretol [carbamazepine]   Review of Systems Review of Systems  Constitutional:  Positive for fatigue and fever.  HENT:  Positive for congestion and rhinorrhea. Negative for sinus pressure, sinus pain and sore throat.   Respiratory:  Positive for cough. Negative for shortness of breath.   Cardiovascular:  Negative for chest pain.  Gastrointestinal:  Negative for abdominal pain, diarrhea, nausea and vomiting.  Musculoskeletal:  Positive for myalgias.  Neurological:  Positive for headaches. Negative for weakness and  light-headedness.  Hematological:  Negative for adenopathy.     Physical Exam Triage Vital Signs ED Triage Vitals  Encounter Vitals Group     BP 10/27/23 1458 (!) 124/93     Systolic BP Percentile --      Diastolic BP Percentile --      Pulse Rate 10/27/23 1458 74     Resp 10/27/23 1458 20     Temp 10/27/23 1458 99 F (37.2 C)     Temp Source 10/27/23 1458 Oral     SpO2 10/27/23 1458 99 %     Weight --      Height --      Head Circumference --      Peak Flow --      Pain Score 10/27/23 1501 7     Pain Loc --      Pain Education --      Exclude from Growth Chart --    No data found.  Updated Vital Signs BP (!) 124/93 (BP Location: Left Arm)   Pulse 74   Temp 99 F (37.2 C) (Oral)   Resp 20   SpO2 99%    Physical Exam Vitals and nursing note reviewed.  Constitutional:      General: He is not in acute distress.    Appearance: Normal appearance. He is well-developed. He is not ill-appearing.  HENT:     Head: Normocephalic and atraumatic.     Nose: Congestion and rhinorrhea present.     Mouth/Throat:     Mouth: Mucous membranes are moist.     Pharynx: Oropharynx is clear.  Eyes:     General: No scleral icterus.    Conjunctiva/sclera: Conjunctivae normal.  Cardiovascular:     Rate and Rhythm: Normal rate and regular rhythm.     Heart sounds: Normal heart sounds.  Pulmonary:     Effort: Pulmonary effort is normal. No respiratory distress.     Breath sounds: Normal breath sounds.  Musculoskeletal:     Cervical back: Neck supple.  Skin:    General: Skin is warm and dry.     Capillary Refill: Capillary refill takes less than 2 seconds.  Neurological:     General: No focal deficit present.     Mental Status: He is alert. Mental status is at baseline.     Motor: No weakness.     Gait: Gait normal.  Psychiatric:        Mood and Affect: Mood normal.        Behavior: Behavior normal.      UC Treatments / Results  Labs (all labs ordered are listed, but  only abnormal results are displayed) Labs Reviewed  RESP PANEL BY RT-PCR (FLU A&B, COVID)  ARPGX2    EKG   Radiology No results found.  Procedures Procedures (including critical care time)  Medications Ordered in UC Medications - No data to display  Initial Impression / Assessment and Plan / UC Course  I have reviewed the triage vital signs and the nursing notes.  Pertinent labs & imaging results that were available during my care of the patient were reviewed by me and considered in my medical decision making (see chart for details).   42 year old male presents for fever, fatigue, cough, congestion/runny nose that began in the past 24 hours.  Has had multiple flu exposures in the same household.  Taking TheraFlu.  He is currently afebrile and overall well-appearing.  No acute distress.  On exam is nasal congestion.  Throat is clear.  Chest clear.  Respiratory panel obtained.  Likely influenza given exposures.  Sent Tamiflu to pharmacy.  Also sent Promethazine DM.  Reviewed current CDC guidelines, isolation protocol and ED precautions were flu.  Reviewed return as needed.  Positive influenza A.  Result communicated to patient through MyChart.  Final Clinical Impressions(s) / UC Diagnoses   Final diagnoses:  Viral illness  Acute cough  Fever, unspecified     Discharge Instructions      -I will call you once I get your flu results but you will likely have the flu since your family members do.  Will send Tamiflu for you if flu is positive and also consider it even if it is negative given your exposure. - I sent cough medicine to pharmacy.  Increase rest and fluids - Isolate until you are fever free for 24 hours and your symptoms are improving.     ED Prescriptions     Medication Sig Dispense Auth. Provider   promethazine-dextromethorphan (PROMETHAZINE-DM) 6.25-15 MG/5ML syrup Take 5 mLs by mouth 4 (four) times daily as needed. 118 mL Eusebio Friendly B, PA-C    oseltamivir (TAMIFLU) 75 MG capsule Take 1 capsule (75 mg total) by mouth every 12 (twelve) hours for 5 days. 10 capsule Shirlee Latch, PA-C      PDMP not reviewed this encounter.   Shirlee Latch, PA-C 10/27/23 [phone]

## 2023-10-27 NOTE — Discharge Instructions (Signed)
-  I will call you once I get your flu results but you will likely have the flu since your family members do.  Will send Tamiflu for you if flu is positive and also consider it even if it is negative given your exposure. - I sent cough medicine to pharmacy.  Increase rest and fluids - Isolate until you are fever free for 24 hours and your symptoms are improving.

## 2023-11-04 DIAGNOSIS — C44319 Basal cell carcinoma of skin of other parts of face: Principal | ICD-10-CM

## 2023-11-20 ENCOUNTER — Ambulatory Visit
Admit: 2023-11-20 | Discharge: 2023-12-19 | Payer: PRIVATE HEALTH INSURANCE | Attending: Rehabilitative and Restorative Service Providers" | Primary: Rehabilitative and Restorative Service Providers"

## 2023-11-20 ENCOUNTER — Ambulatory Visit
Admit: 2023-11-20 | Payer: PRIVATE HEALTH INSURANCE | Attending: Rehabilitative and Restorative Service Providers" | Primary: Rehabilitative and Restorative Service Providers"

## 2023-11-20 ENCOUNTER — Ambulatory Visit: Admit: 2023-11-20 | Payer: PRIVATE HEALTH INSURANCE

## 2023-12-08 ENCOUNTER — Inpatient Hospital Stay: Admit: 2023-12-08 | Discharge: 2023-12-09 | Payer: PRIVATE HEALTH INSURANCE

## 2023-12-08 ENCOUNTER — Ambulatory Visit: Admit: 2023-12-08 | Discharge: 2023-12-09 | Payer: PRIVATE HEALTH INSURANCE

## 2023-12-08 DIAGNOSIS — B182 Chronic viral hepatitis C: Principal | ICD-10-CM

## 2023-12-08 DIAGNOSIS — K7460 Unspecified cirrhosis of liver: Principal | ICD-10-CM

## 2023-12-08 MED ORDER — OMEPRAZOLE 40 MG CAPSULE,DELAYED RELEASE
ORAL_CAPSULE | Freq: Every day | ORAL | 0 refills | 60.00 days | Status: CP
Start: 2023-12-08 — End: 2024-02-06

## 2023-12-09 MED ORDER — CARVEDILOL 6.25 MG TABLET
ORAL_TABLET | Freq: Two times a day (BID) | ORAL | 11 refills | 30.00 days | Status: CP
Start: 2023-12-09 — End: ?

## 2023-12-16 ENCOUNTER — Inpatient Hospital Stay: Admit: 2023-12-16 | Discharge: 2023-12-17 | Payer: PRIVATE HEALTH INSURANCE

## 2023-12-23 ENCOUNTER — Ambulatory Visit
Admit: 2023-12-23 | Payer: PRIVATE HEALTH INSURANCE | Attending: Rehabilitative and Restorative Service Providers" | Primary: Rehabilitative and Restorative Service Providers"

## 2023-12-23 ENCOUNTER — Ambulatory Visit: Admit: 2023-12-23 | Payer: PRIVATE HEALTH INSURANCE

## 2024-01-05 ENCOUNTER — Encounter: Admit: 2024-01-05 | Discharge: 2024-01-06 | Payer: PRIVATE HEALTH INSURANCE

## 2024-01-05 ENCOUNTER — Ambulatory Visit
Admit: 2024-01-05 | Discharge: 2024-01-06 | Payer: PRIVATE HEALTH INSURANCE | Attending: Anesthesiology | Primary: Anesthesiology

## 2024-01-05 DIAGNOSIS — G8929 Other chronic pain: Principal | ICD-10-CM

## 2024-01-05 DIAGNOSIS — F1190 Opioid use, unspecified, uncomplicated: Principal | ICD-10-CM

## 2024-01-05 DIAGNOSIS — F1591 History of methamphetamine use: Principal | ICD-10-CM

## 2024-01-05 DIAGNOSIS — M549 Dorsalgia, unspecified: Principal | ICD-10-CM

## 2024-01-05 DIAGNOSIS — F17200 Nicotine dependence, unspecified, uncomplicated: Principal | ICD-10-CM

## 2024-01-05 MED ORDER — NALOXONE 4 MG/ACTUATION NASAL SPRAY
0 refills | 0.00 days | Status: CP
Start: 2024-01-05 — End: ?

## 2024-01-05 MED ORDER — BUPRENORPHINE 8 MG-NALOXONE 2 MG SUBLINGUAL FILM
ORAL_FILM | 0 refills | 7.00 days | Status: CP
Start: 2024-01-05 — End: 2024-01-12

## 2024-01-12 ENCOUNTER — Encounter: Admit: 2024-01-12 | Discharge: 2024-01-13 | Payer: PRIVATE HEALTH INSURANCE

## 2024-01-12 DIAGNOSIS — G8929 Other chronic pain: Principal | ICD-10-CM

## 2024-01-12 DIAGNOSIS — M545 Chronic bilateral low back pain, unspecified whether sciatica present: Principal | ICD-10-CM

## 2024-01-12 DIAGNOSIS — F1190 Opioid use, unspecified, uncomplicated: Principal | ICD-10-CM

## 2024-01-12 MED ORDER — BUPRENORPHINE 8 MG-NALOXONE 2 MG SUBLINGUAL FILM
ORAL_FILM | Freq: Two times a day (BID) | SUBLINGUAL | 0 refills | 8.00 days | Status: CP
Start: 2024-01-12 — End: 2024-01-20

## 2024-01-19 ENCOUNTER — Encounter: Admit: 2024-01-19 | Discharge: 2024-01-20 | Payer: PRIVATE HEALTH INSURANCE

## 2024-01-19 DIAGNOSIS — F1190 Opioid use, unspecified, uncomplicated: Principal | ICD-10-CM

## 2024-01-19 DIAGNOSIS — F17200 Nicotine dependence, unspecified, uncomplicated: Principal | ICD-10-CM

## 2024-01-19 MED ORDER — BUPRENORPHINE 8 MG-NALOXONE 2 MG SUBLINGUAL FILM
ORAL_FILM | 0 refills | 0 days | Status: CP
Start: 2024-01-19 — End: ?

## 2024-01-21 MED ORDER — GABAPENTIN 300 MG CAPSULE
ORAL_CAPSULE | ORAL | 0 refills | 0 days
Start: 2024-01-21 — End: ?

## 2024-01-22 ENCOUNTER — Ambulatory Visit: Admit: 2024-01-22 | Discharge: 2024-01-23

## 2024-01-22 MED ORDER — GABAPENTIN 300 MG CAPSULE
ORAL_CAPSULE | ORAL | 0 refills | 0 days
Start: 2024-01-22 — End: ?

## 2024-01-22 MED ORDER — HYDROCODONE 5 MG-ACETAMINOPHEN 325 MG TABLET
ORAL_TABLET | ORAL | 0 refills | 2 days | Status: CP | PRN
Start: 2024-01-22 — End: ?

## 2024-01-23 MED ORDER — GABAPENTIN 300 MG CAPSULE
ORAL_CAPSULE | Freq: Every evening | ORAL | 2 refills | 30 days | Status: CP
Start: 2024-01-23 — End: 2024-04-22

## 2024-01-26 ENCOUNTER — Encounter: Admit: 2024-01-26 | Discharge: 2024-01-27

## 2024-01-26 DIAGNOSIS — G8929 Other chronic pain: Principal | ICD-10-CM

## 2024-01-26 DIAGNOSIS — M545 Chronic bilateral low back pain, unspecified whether sciatica present: Principal | ICD-10-CM

## 2024-01-26 DIAGNOSIS — F1190 Opioid use, unspecified, uncomplicated: Principal | ICD-10-CM

## 2024-01-26 MED ORDER — BUPRENORPHINE 8 MG-NALOXONE 2 MG SUBLINGUAL FILM
ORAL_FILM | 0 refills | 9 days | Status: CP
Start: 2024-01-26 — End: 2024-02-04

## 2024-02-03 ENCOUNTER — Encounter: Admit: 2024-02-03 | Discharge: 2024-02-04

## 2024-02-03 DIAGNOSIS — M545 Chronic bilateral low back pain, unspecified whether sciatica present: Principal | ICD-10-CM

## 2024-02-03 DIAGNOSIS — G8929 Other chronic pain: Principal | ICD-10-CM

## 2024-02-03 DIAGNOSIS — F1190 Opioid use, unspecified, uncomplicated: Principal | ICD-10-CM

## 2024-02-03 MED ORDER — BUPRENORPHINE 8 MG-NALOXONE 2 MG SUBLINGUAL FILM
ORAL_FILM | Freq: Three times a day (TID) | SUBLINGUAL | 0 refills | 8.00 days | Status: CP
Start: 2024-02-03 — End: 2024-02-11

## 2024-02-10 ENCOUNTER — Encounter: Admit: 2024-02-10 | Discharge: 2024-02-11 | Payer: Medicaid (Managed Care)

## 2024-02-10 DIAGNOSIS — M545 Chronic bilateral low back pain, unspecified whether sciatica present: Principal | ICD-10-CM

## 2024-02-10 DIAGNOSIS — G8929 Other chronic pain: Principal | ICD-10-CM

## 2024-02-10 DIAGNOSIS — F1190 Opioid use, unspecified, uncomplicated: Principal | ICD-10-CM

## 2024-02-10 MED ORDER — BUPRENORPHINE 8 MG-NALOXONE 2 MG SUBLINGUAL FILM
ORAL_FILM | Freq: Three times a day (TID) | SUBLINGUAL | 0 refills | 8.00 days | Status: CP
Start: 2024-02-10 — End: 2024-02-18

## 2024-02-16 ENCOUNTER — Encounter: Admit: 2024-02-16 | Discharge: 2024-02-17 | Payer: Medicaid (Managed Care)

## 2024-02-16 DIAGNOSIS — M545 Chronic bilateral low back pain, unspecified whether sciatica present: Principal | ICD-10-CM

## 2024-02-16 DIAGNOSIS — F1591 History of methamphetamine use: Principal | ICD-10-CM

## 2024-02-16 DIAGNOSIS — G8929 Other chronic pain: Principal | ICD-10-CM

## 2024-02-16 DIAGNOSIS — F1190 Opioid use, unspecified, uncomplicated: Principal | ICD-10-CM

## 2024-02-17 ENCOUNTER — Inpatient Hospital Stay: Admit: 2024-02-17 | Discharge: 2024-02-18 | Payer: Medicaid (Managed Care)

## 2024-02-17 ENCOUNTER — Ambulatory Visit: Admit: 2024-02-17 | Discharge: 2024-02-18 | Payer: Medicaid (Managed Care) | Attending: Family | Primary: Family

## 2024-02-17 DIAGNOSIS — M25511 Pain in right shoulder: Principal | ICD-10-CM

## 2024-02-17 MED ORDER — BUPRENORPHINE 8 MG-NALOXONE 2 MG SUBLINGUAL FILM
ORAL_FILM | Freq: Three times a day (TID) | SUBLINGUAL | 0 refills | 8.00 days | Status: CP
Start: 2024-02-17 — End: 2024-02-25

## 2024-02-18 ENCOUNTER — Encounter: Admit: 2024-02-18 | Discharge: 2024-02-19 | Payer: Medicaid (Managed Care) | Attending: Clinical | Primary: Clinical

## 2024-02-18 DIAGNOSIS — G894 Chronic pain syndrome: Principal | ICD-10-CM

## 2024-02-24 ENCOUNTER — Encounter: Admit: 2024-02-24 | Discharge: 2024-02-25 | Payer: Medicaid (Managed Care)

## 2024-02-24 DIAGNOSIS — G8929 Other chronic pain: Principal | ICD-10-CM

## 2024-02-24 DIAGNOSIS — M545 Chronic bilateral low back pain, unspecified whether sciatica present: Principal | ICD-10-CM

## 2024-02-24 DIAGNOSIS — F1190 Opioid use, unspecified, uncomplicated: Principal | ICD-10-CM

## 2024-02-24 MED ORDER — BUPRENORPHINE 8 MG-NALOXONE 2 MG SUBLINGUAL FILM
ORAL_FILM | SUBLINGUAL | 0 refills | 7.00000 days | Status: CP
Start: 2024-02-24 — End: 2024-03-02

## 2024-03-02 ENCOUNTER — Encounter: Admit: 2024-03-02 | Discharge: 2024-03-03 | Payer: Medicaid (Managed Care)

## 2024-03-02 DIAGNOSIS — F1190 Opioid use, unspecified, uncomplicated: Principal | ICD-10-CM

## 2024-03-02 DIAGNOSIS — G8929 Other chronic pain: Principal | ICD-10-CM

## 2024-03-02 DIAGNOSIS — M545 Chronic bilateral low back pain, unspecified whether sciatica present: Principal | ICD-10-CM

## 2024-03-02 MED ORDER — BUPRENORPHINE 8 MG-NALOXONE 2 MG SUBLINGUAL FILM
ORAL_FILM | SUBLINGUAL | 0 refills | 7.00000 days | Status: CP
Start: 2024-03-02 — End: 2024-03-09

## 2024-03-09 ENCOUNTER — Encounter: Admit: 2024-03-09 | Discharge: 2024-03-10 | Payer: Medicaid (Managed Care)

## 2024-03-09 DIAGNOSIS — M545 Chronic bilateral low back pain, unspecified whether sciatica present: Principal | ICD-10-CM

## 2024-03-09 DIAGNOSIS — F1190 Opioid use, unspecified, uncomplicated: Principal | ICD-10-CM

## 2024-03-09 DIAGNOSIS — G8929 Other chronic pain: Principal | ICD-10-CM

## 2024-03-09 DIAGNOSIS — F1591 History of methamphetamine use: Principal | ICD-10-CM

## 2024-03-09 DIAGNOSIS — F17200 Nicotine dependence, unspecified, uncomplicated: Principal | ICD-10-CM

## 2024-03-09 MED ORDER — BUPRENORPHINE 8 MG-NALOXONE 2 MG SUBLINGUAL FILM
ORAL_FILM | SUBLINGUAL | 0 refills | 14.00000 days | Status: CP
Start: 2024-03-09 — End: 2024-03-23

## 2024-03-11 ENCOUNTER — Ambulatory Visit: Attending: Internal Medicine

## 2024-03-11 DIAGNOSIS — G4730 Sleep apnea, unspecified: Secondary | ICD-10-CM | POA: Insufficient documentation

## 2024-03-11 DIAGNOSIS — I493 Ventricular premature depolarization: Secondary | ICD-10-CM | POA: Diagnosis not present

## 2024-03-11 DIAGNOSIS — G478 Other sleep disorders: Secondary | ICD-10-CM | POA: Diagnosis not present

## 2024-03-11 DIAGNOSIS — G4736 Sleep related hypoventilation in conditions classified elsewhere: Secondary | ICD-10-CM | POA: Insufficient documentation

## 2024-03-11 DIAGNOSIS — R0683 Snoring: Secondary | ICD-10-CM | POA: Diagnosis present

## 2024-03-12 ENCOUNTER — Ambulatory Visit: Admit: 2024-03-12 | Payer: Medicaid (Managed Care)

## 2024-03-23 ENCOUNTER — Encounter: Admit: 2024-03-23 | Discharge: 2024-03-24 | Payer: Medicaid (Managed Care)

## 2024-03-23 DIAGNOSIS — F1521 Other stimulant dependence, in remission: Principal | ICD-10-CM

## 2024-03-23 DIAGNOSIS — G8929 Other chronic pain: Principal | ICD-10-CM

## 2024-03-23 DIAGNOSIS — F1121 Opioid dependence, in remission: Principal | ICD-10-CM

## 2024-03-23 DIAGNOSIS — M545 Chronic bilateral low back pain, unspecified whether sciatica present: Principal | ICD-10-CM

## 2024-03-23 MED ORDER — BUPRENORPHINE 8 MG-NALOXONE 2 MG SUBLINGUAL FILM
ORAL_FILM | SUBLINGUAL | 0 refills | 14.00000 days | Status: CP
Start: 2024-03-23 — End: 2024-04-06

## 2024-03-25 DIAGNOSIS — M5412 Radiculopathy, cervical region: Principal | ICD-10-CM

## 2024-03-25 DIAGNOSIS — M5416 Radiculopathy, lumbar region: Principal | ICD-10-CM

## 2024-03-29 ENCOUNTER — Encounter
Admit: 2024-03-29 | Discharge: 2024-03-30 | Payer: Medicaid (Managed Care) | Attending: Anesthesiology | Primary: Anesthesiology

## 2024-03-29 DIAGNOSIS — G894 Chronic pain syndrome: Principal | ICD-10-CM

## 2024-03-29 DIAGNOSIS — K7460 Unspecified cirrhosis of liver: Principal | ICD-10-CM

## 2024-03-29 DIAGNOSIS — M5412 Radiculopathy, cervical region: Principal | ICD-10-CM

## 2024-03-29 DIAGNOSIS — M5441 Lumbago with sciatica, right side: Principal | ICD-10-CM

## 2024-03-29 DIAGNOSIS — F1190 Opioid use, unspecified, uncomplicated: Principal | ICD-10-CM

## 2024-03-29 DIAGNOSIS — G8929 Other chronic pain: Principal | ICD-10-CM

## 2024-03-29 DIAGNOSIS — M5416 Radiculopathy, lumbar region: Principal | ICD-10-CM

## 2024-03-29 MED ORDER — PREGABALIN 25 MG CAPSULE
ORAL_CAPSULE | Freq: Two times a day (BID) | ORAL | 0 refills | 60.00000 days | Status: CP
Start: 2024-03-29 — End: ?

## 2024-03-29 MED ORDER — GABAPENTIN 300 MG CAPSULE
ORAL_CAPSULE | Freq: Every evening | ORAL | 2 refills | 30.00000 days | Status: CN
Start: 2024-03-29 — End: 2024-06-27

## 2024-04-06 ENCOUNTER — Encounter: Admit: 2024-04-06 | Discharge: 2024-04-07 | Payer: Medicaid (Managed Care)

## 2024-04-06 DIAGNOSIS — F1521 Other stimulant dependence, in remission: Principal | ICD-10-CM

## 2024-04-06 DIAGNOSIS — F17200 Nicotine dependence, unspecified, uncomplicated: Principal | ICD-10-CM

## 2024-04-06 DIAGNOSIS — M545 Chronic bilateral low back pain, unspecified whether sciatica present: Principal | ICD-10-CM

## 2024-04-06 DIAGNOSIS — F1121 Opioid dependence, in remission: Principal | ICD-10-CM

## 2024-04-06 DIAGNOSIS — G8929 Other chronic pain: Principal | ICD-10-CM

## 2024-04-06 MED ORDER — BUPRENORPHINE 8 MG-NALOXONE 2 MG SUBLINGUAL FILM
ORAL_FILM | SUBLINGUAL | 0 refills | 14.00000 days | Status: CP
Start: 2024-04-06 — End: 2024-04-20

## 2024-04-14 ENCOUNTER — Ambulatory Visit: Payer: Medicaid Other | Admitting: Dermatology

## 2024-04-20 DIAGNOSIS — M545 Chronic bilateral low back pain, unspecified whether sciatica present: Principal | ICD-10-CM

## 2024-04-20 DIAGNOSIS — F1521 Other stimulant dependence, in remission: Principal | ICD-10-CM

## 2024-04-20 DIAGNOSIS — G8929 Other chronic pain: Principal | ICD-10-CM

## 2024-04-20 DIAGNOSIS — F1121 Opioid dependence, in remission: Principal | ICD-10-CM

## 2024-04-20 MED ORDER — BUPRENORPHINE 8 MG-NALOXONE 2 MG SUBLINGUAL FILM
ORAL_FILM | Freq: Three times a day (TID) | SUBLINGUAL | 0 refills | 7.00000 days | Status: CP
Start: 2024-04-20 — End: 2024-04-27

## 2024-05-03 ENCOUNTER — Inpatient Hospital Stay: Admit: 2024-05-03 | Discharge: 2024-05-03 | Payer: Medicaid (Managed Care)

## 2024-05-11 ENCOUNTER — Ambulatory Visit
Admit: 2024-05-11 | Discharge: 2024-05-12 | Payer: Medicaid (Managed Care) | Attending: Anesthesiology | Primary: Anesthesiology

## 2024-05-11 DIAGNOSIS — M542 Cervicalgia: Principal | ICD-10-CM

## 2024-05-11 DIAGNOSIS — M5416 Radiculopathy, lumbar region: Principal | ICD-10-CM

## 2024-05-11 MED ORDER — PREGABALIN 25 MG CAPSULE
ORAL_CAPSULE | Freq: Three times a day (TID) | ORAL | 0 refills | 90.00000 days | Status: CP
Start: 2024-05-11 — End: 2024-08-09

## 2024-05-20 ENCOUNTER — Ambulatory Visit: Admit: 2024-05-20 | Discharge: 2024-05-21 | Payer: Medicaid (Managed Care)

## 2024-05-20 DIAGNOSIS — M549 Dorsalgia, unspecified: Principal | ICD-10-CM

## 2024-05-24 ENCOUNTER — Ambulatory Visit
Admit: 2024-05-24 | Discharge: 2024-05-25 | Payer: Medicaid (Managed Care) | Attending: Anesthesiology | Primary: Anesthesiology

## 2024-05-24 DIAGNOSIS — M5416 Radiculopathy, lumbar region: Principal | ICD-10-CM

## 2024-05-24 DIAGNOSIS — M545 Left low back pain, unspecified chronicity, unspecified whether sciatica present: Principal | ICD-10-CM

## 2024-05-25 DIAGNOSIS — M25511 Pain in right shoulder: Principal | ICD-10-CM

## 2024-06-07 ENCOUNTER — Ambulatory Visit: Admit: 2024-06-07 | Discharge: 2024-06-07 | Payer: Medicaid (Managed Care)

## 2024-06-07 ENCOUNTER — Inpatient Hospital Stay: Admit: 2024-06-07 | Discharge: 2024-06-07 | Payer: Medicaid (Managed Care)

## 2024-07-05 ENCOUNTER — Ambulatory Visit: Admit: 2024-07-05 | Payer: Medicaid (Managed Care)

## 2024-07-05 ENCOUNTER — Ambulatory Visit: Admit: 2024-07-05 | Discharge: 2024-07-06 | Payer: Medicaid (Managed Care)

## 2024-07-05 DIAGNOSIS — K7460 Unspecified cirrhosis of liver: Principal | ICD-10-CM

## 2024-07-05 DIAGNOSIS — R1310 Dysphagia, unspecified: Principal | ICD-10-CM

## 2024-07-05 MED ORDER — CARVEDILOL 6.25 MG TABLET
ORAL_TABLET | Freq: Two times a day (BID) | ORAL | 3 refills | 90.00000 days | Status: CP
Start: 2024-07-05 — End: ?

## 2024-07-06 ENCOUNTER — Inpatient Hospital Stay: Admit: 2024-07-06 | Discharge: 2024-07-06 | Payer: Medicaid (Managed Care)

## 2024-07-19 ENCOUNTER — Ambulatory Visit
Admit: 2024-07-19 | Discharge: 2024-07-20 | Payer: Medicaid (Managed Care) | Attending: Anesthesiology | Primary: Anesthesiology

## 2024-07-19 DIAGNOSIS — M25519 Pain in unspecified shoulder: Principal | ICD-10-CM

## 2024-07-19 DIAGNOSIS — M5416 Radiculopathy, lumbar region: Principal | ICD-10-CM

## 2024-07-19 DIAGNOSIS — M79672 Pain in left foot: Principal | ICD-10-CM

## 2024-07-19 MED ORDER — PREGABALIN 25 MG CAPSULE
ORAL_CAPSULE | Freq: Three times a day (TID) | ORAL | 2 refills | 30.00000 days | Status: CP
Start: 2024-07-19 — End: 2024-10-17

## 2024-07-26 ENCOUNTER — Inpatient Hospital Stay: Admit: 2024-07-26 | Discharge: 2024-07-26 | Payer: Medicaid (Managed Care)

## 2024-08-12 ENCOUNTER — Ambulatory Visit: Admit: 2024-08-12 | Payer: Medicaid (Managed Care)

## 2024-08-17 ENCOUNTER — Ambulatory Visit: Admit: 2024-08-17 | Discharge: 2024-08-18 | Payer: Medicaid (Managed Care)

## 2024-08-17 ENCOUNTER — Ambulatory Visit: Admit: 2024-08-17 | Discharge: 2024-08-18 | Payer: Medicaid (Managed Care) | Attending: Family | Primary: Family

## 2024-08-17 DIAGNOSIS — M25511 Pain in right shoulder: Principal | ICD-10-CM

## 2024-08-17 DIAGNOSIS — G8929 Other chronic pain: Principal | ICD-10-CM

## 2024-08-23 ENCOUNTER — Inpatient Hospital Stay: Admit: 2024-08-23 | Discharge: 2024-08-23 | Payer: Medicaid (Managed Care)

## 2024-08-23 ENCOUNTER — Ambulatory Visit: Admit: 2024-08-23 | Discharge: 2024-08-23 | Payer: Medicaid (Managed Care)

## 2024-08-23 DIAGNOSIS — M5416 Radiculopathy, lumbar region: Principal | ICD-10-CM

## 2024-08-23 DIAGNOSIS — M79672 Pain in left foot: Principal | ICD-10-CM

## 2024-08-23 DIAGNOSIS — M25519 Pain in unspecified shoulder: Principal | ICD-10-CM

## 2024-08-23 DIAGNOSIS — M79671 Pain in right foot: Principal | ICD-10-CM

## 2024-08-31 ENCOUNTER — Ambulatory Visit
Admit: 2024-08-31 | Discharge: 2024-09-01 | Payer: Medicaid (Managed Care) | Attending: Emergency Medicine | Primary: Emergency Medicine

## 2024-08-31 DIAGNOSIS — M19011 Primary osteoarthritis, right shoulder: Principal | ICD-10-CM

## 2024-08-31 DIAGNOSIS — M25819 Other specified joint disorders, unspecified shoulder: Principal | ICD-10-CM

## 2024-08-31 DIAGNOSIS — G8929 Other chronic pain: Principal | ICD-10-CM

## 2024-08-31 DIAGNOSIS — M25511 Pain in right shoulder: Principal | ICD-10-CM

## 2024-08-31 DIAGNOSIS — M7551 Bursitis of right shoulder: Principal | ICD-10-CM

## 2024-09-13 ENCOUNTER — Ambulatory Visit: Admit: 2024-09-13 | Discharge: 2024-09-14 | Payer: Medicaid (Managed Care)

## 2024-09-27 ENCOUNTER — Ambulatory Visit: Admit: 2024-09-27 | Discharge: 2024-09-28 | Payer: Medicaid (Managed Care)

## 2024-09-28 ENCOUNTER — Ambulatory Visit: Admit: 2024-09-28 | Discharge: 2024-10-02 | Payer: Medicaid (Managed Care)

## 2024-09-28 ENCOUNTER — Ambulatory Visit: Admit: 2024-09-28 | Payer: Medicaid (Managed Care)

## 2024-11-02 ENCOUNTER — Ambulatory Visit
Admit: 2024-11-02 | Discharge: 2024-11-03 | Payer: Medicaid (Managed Care) | Attending: Anesthesiology | Primary: Anesthesiology

## 2024-11-02 DIAGNOSIS — M5416 Radiculopathy, lumbar region: Principal | ICD-10-CM

## 2024-11-02 DIAGNOSIS — M545 Left low back pain, unspecified chronicity, unspecified whether sciatica present: Principal | ICD-10-CM

## 2024-11-02 MED ORDER — PREGABALIN 50 MG CAPSULE
ORAL_CAPSULE | Freq: Three times a day (TID) | ORAL | 2 refills | 30.00000 days | Status: CP
Start: 2024-11-02 — End: 2025-01-31

## 2024-11-30 ENCOUNTER — Ambulatory Visit: Admit: 2024-11-30 | Discharge: 2024-12-01 | Payer: Medicaid (Managed Care) | Attending: Family | Primary: Family

## 2024-11-30 DIAGNOSIS — M25511 Pain in right shoulder: Secondary | ICD-10-CM

## 2024-11-30 DIAGNOSIS — G5623 Lesion of ulnar nerve, bilateral upper limbs: Secondary | ICD-10-CM

## 2024-11-30 DIAGNOSIS — M7711 Lateral epicondylitis, right elbow: Principal | ICD-10-CM

## 2024-11-30 DIAGNOSIS — G8929 Other chronic pain: Secondary | ICD-10-CM

## 2024-12-02 ENCOUNTER — Other Ambulatory Visit: Payer: Self-pay | Admitting: Family Medicine

## 2024-12-02 DIAGNOSIS — R61 Generalized hyperhidrosis: Secondary | ICD-10-CM
